# Patient Record
Sex: Male | Born: 1955
Health system: Southern US, Community
[De-identification: ages and names within clinical notes are randomized; demographics above are authoritative.]

## PROBLEM LIST (undated history)

## (undated) DIAGNOSIS — I10 Essential (primary) hypertension: Secondary | ICD-10-CM

## (undated) DIAGNOSIS — K219 Gastro-esophageal reflux disease without esophagitis: Secondary | ICD-10-CM

## (undated) DIAGNOSIS — E119 Type 2 diabetes mellitus without complications: Secondary | ICD-10-CM

## (undated) DIAGNOSIS — M069 Rheumatoid arthritis, unspecified: Secondary | ICD-10-CM

## (undated) DIAGNOSIS — E785 Hyperlipidemia, unspecified: Secondary | ICD-10-CM

## (undated) HISTORY — PX: TONSILLECTOMY: SUR1361

## (undated) HISTORY — PX: URETHRAL STRICTURE DILATATION: SHX477

---

## 1997-10-09 ENCOUNTER — Ambulatory Visit (HOSPITAL_BASED_OUTPATIENT_CLINIC_OR_DEPARTMENT_OTHER): Admission: RE | Admit: 1997-10-09 | Discharge: 1997-10-09 | Payer: Self-pay | Admitting: Urology

## 1998-10-25 ENCOUNTER — Other Ambulatory Visit: Admission: RE | Admit: 1998-10-25 | Discharge: 1998-10-25 | Payer: Self-pay | Admitting: Gastroenterology

## 2000-11-30 ENCOUNTER — Encounter: Payer: Self-pay | Admitting: Emergency Medicine

## 2000-11-30 ENCOUNTER — Inpatient Hospital Stay (HOSPITAL_COMMUNITY): Admission: EM | Admit: 2000-11-30 | Discharge: 2000-12-02 | Payer: Self-pay | Admitting: Plastic Surgery

## 2000-12-01 ENCOUNTER — Encounter: Payer: Self-pay | Admitting: Pulmonary Disease

## 2001-01-07 ENCOUNTER — Encounter: Payer: Self-pay | Admitting: Endocrinology

## 2001-01-07 ENCOUNTER — Ambulatory Visit (HOSPITAL_COMMUNITY): Admission: RE | Admit: 2001-01-07 | Discharge: 2001-01-07 | Payer: Self-pay | Admitting: Endocrinology

## 2001-02-04 ENCOUNTER — Encounter: Payer: Self-pay | Admitting: Endocrinology

## 2001-02-04 ENCOUNTER — Ambulatory Visit (HOSPITAL_COMMUNITY): Admission: RE | Admit: 2001-02-04 | Discharge: 2001-02-04 | Payer: Self-pay | Admitting: Endocrinology

## 2003-07-07 ENCOUNTER — Ambulatory Visit (HOSPITAL_COMMUNITY): Admission: RE | Admit: 2003-07-07 | Discharge: 2003-07-07 | Payer: Self-pay | Admitting: Internal Medicine

## 2004-04-18 ENCOUNTER — Ambulatory Visit: Payer: Self-pay | Admitting: Endocrinology

## 2004-04-20 ENCOUNTER — Ambulatory Visit: Payer: Self-pay | Admitting: Endocrinology

## 2004-05-31 ENCOUNTER — Ambulatory Visit: Payer: Self-pay | Admitting: Endocrinology

## 2005-05-09 ENCOUNTER — Ambulatory Visit: Payer: Self-pay | Admitting: Endocrinology

## 2005-07-10 ENCOUNTER — Ambulatory Visit: Payer: Self-pay | Admitting: Endocrinology

## 2005-07-13 ENCOUNTER — Ambulatory Visit: Payer: Self-pay | Admitting: Endocrinology

## 2006-01-20 ENCOUNTER — Emergency Department (HOSPITAL_COMMUNITY): Admission: EM | Admit: 2006-01-20 | Discharge: 2006-01-20 | Payer: Self-pay | Admitting: Family Medicine

## 2006-01-20 IMAGING — CR DG KNEE 1-2V*L*
2 series · 2 of 2 positions shown · non-contrast
Comparison: none

CLINICAL DATA: Left knee pain.  
 LEFT KNEE ? 2 VIEW:

[view not recorded (1 of 2)]
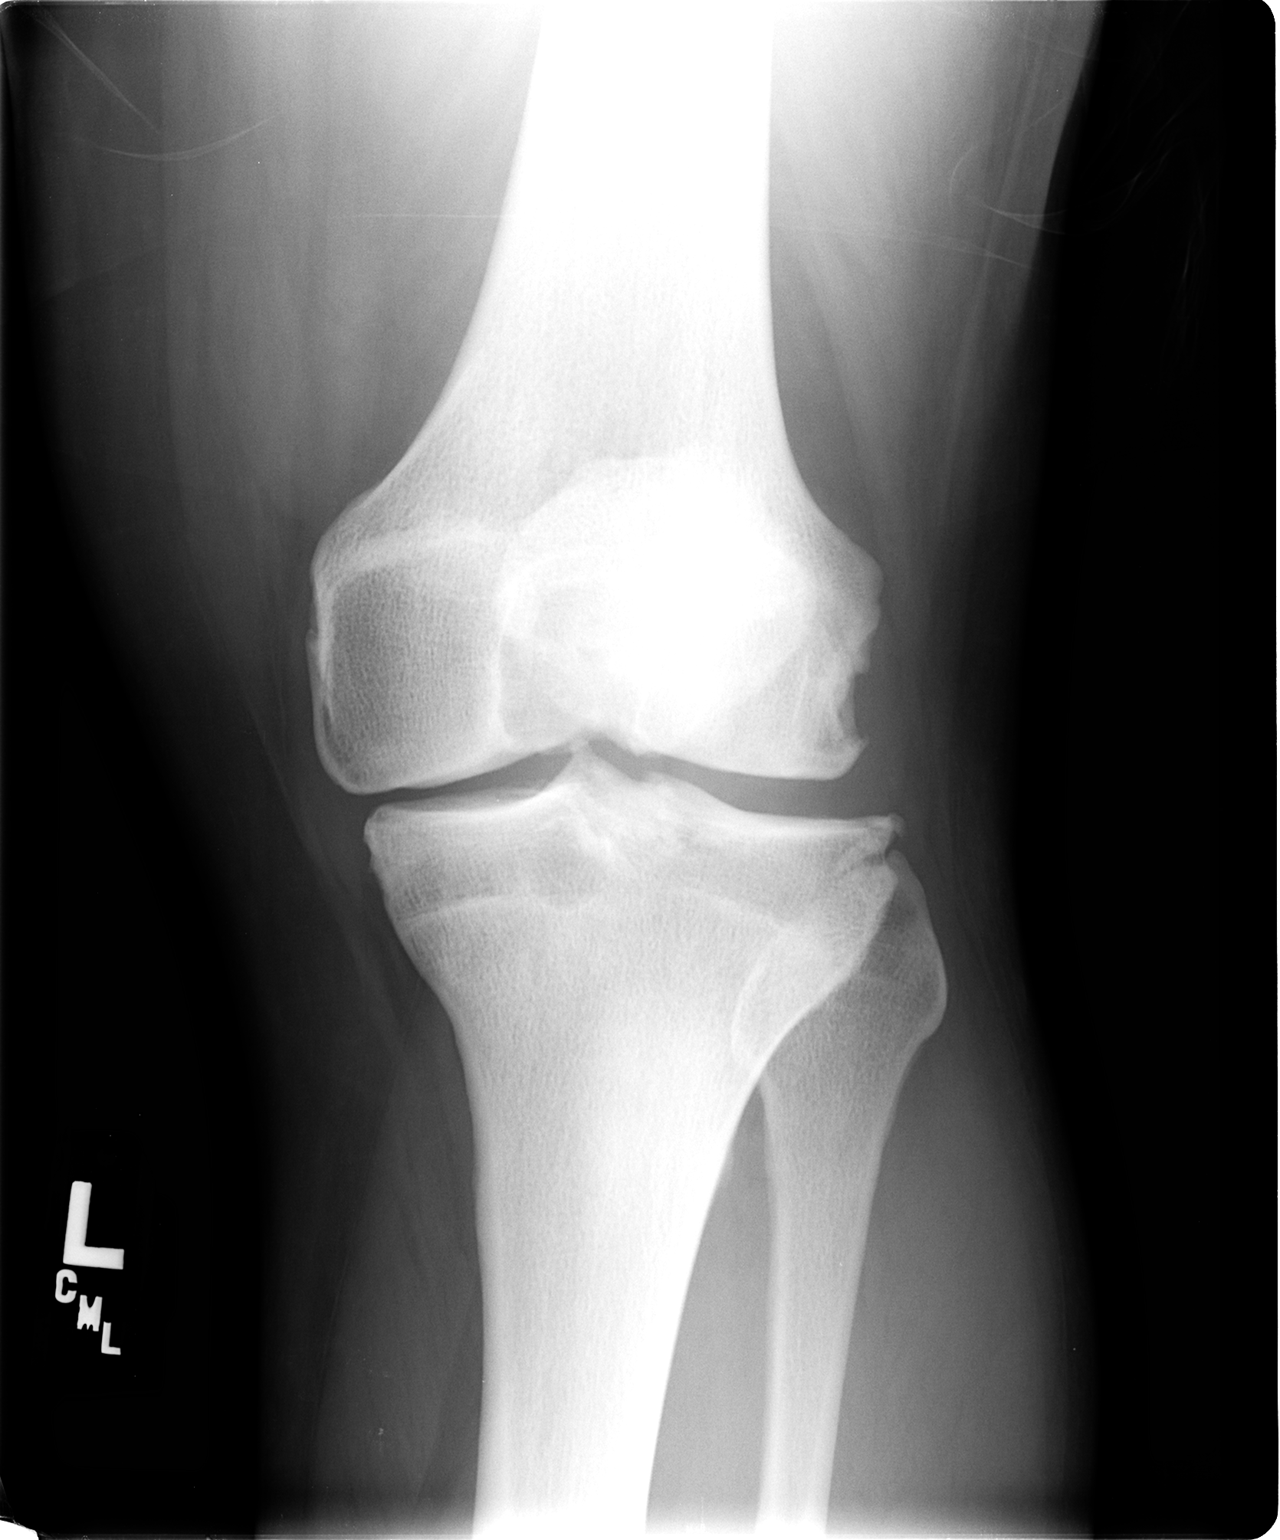

[view not recorded (2 of 2)]
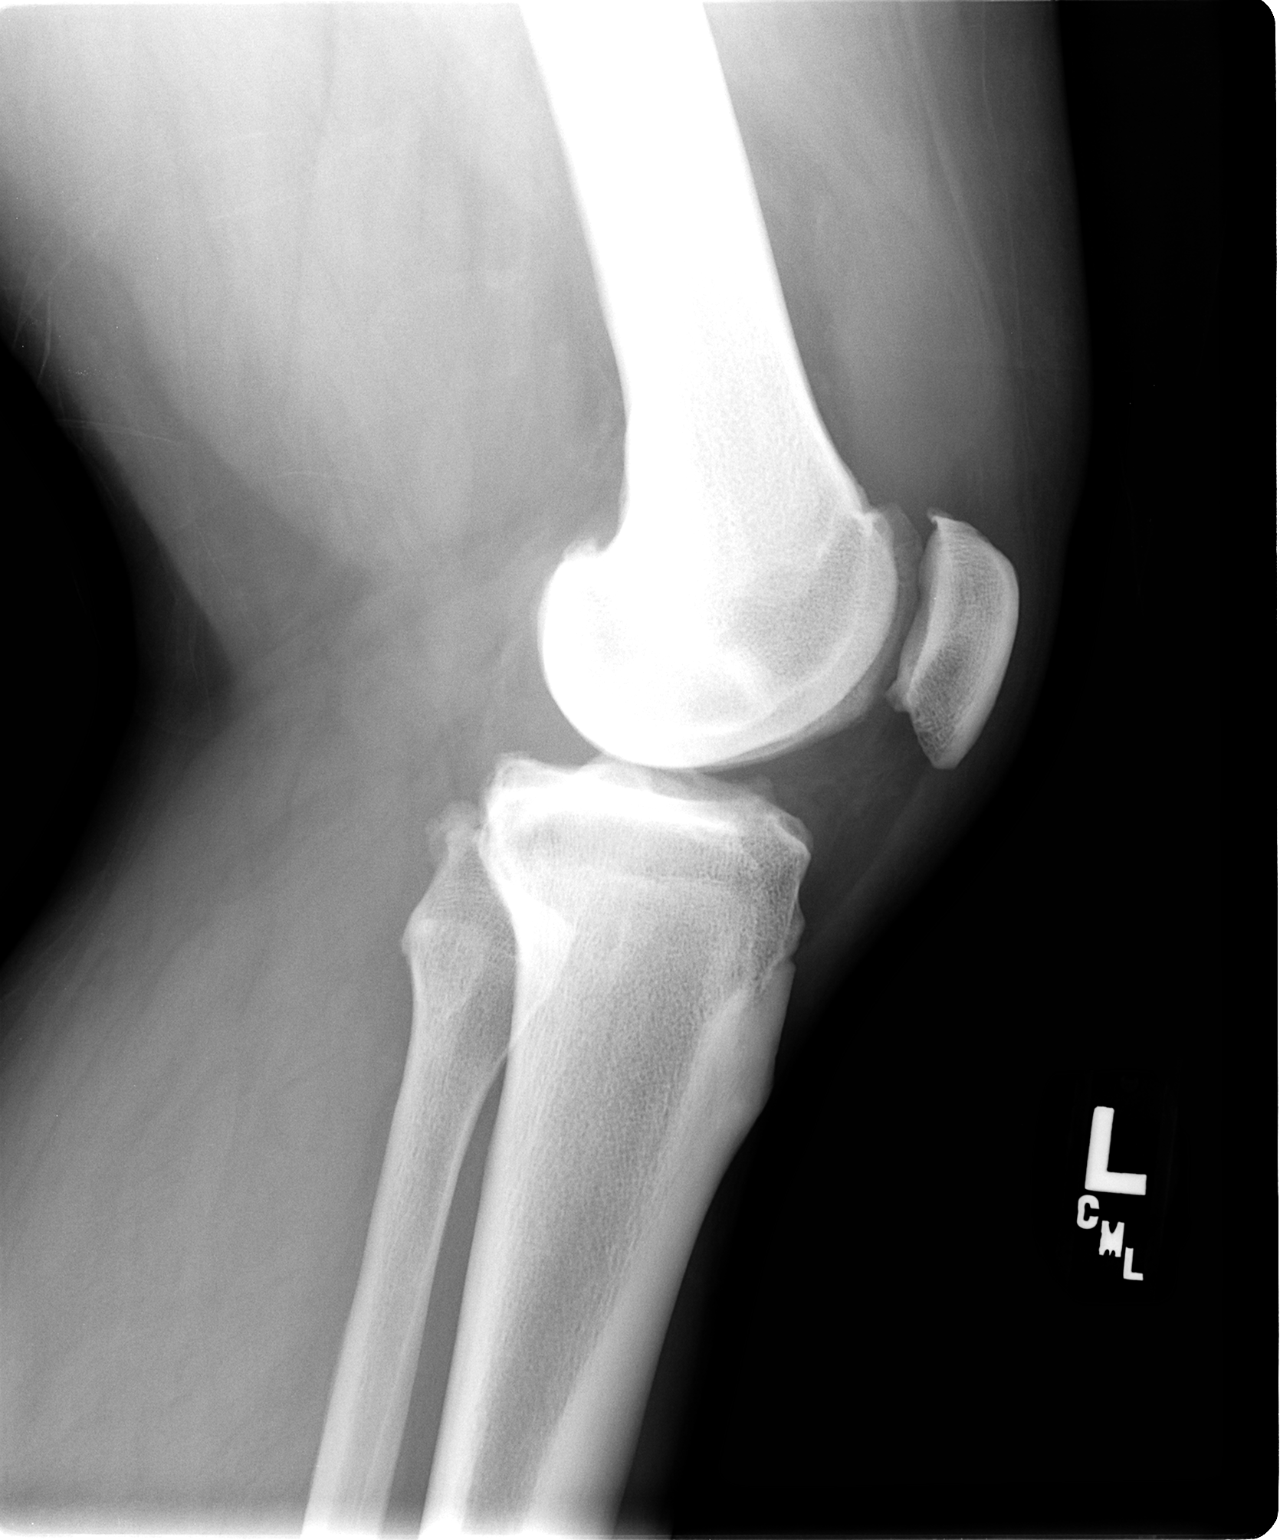

[2 of 2 positions shown; findings below may reference images not displayed]

FINDINGS: Tricompartmental degenerative change with subchondral cystic change within the tibia and femur.  Medial joint space loss.  Patellofemoral degenerative changes with large joint effusion.
IMPRESSION: Tricompartmental degenerative change mostly involving the patellofemoral medial compartments.  I suspect that the patient has underlying CPPD arthropathy.

## 2006-01-29 ENCOUNTER — Ambulatory Visit: Payer: Self-pay | Admitting: Endocrinology

## 2006-07-09 ENCOUNTER — Ambulatory Visit: Payer: Self-pay | Admitting: Endocrinology

## 2006-07-09 LAB — CONVERTED CEMR LAB
ALT: 42 units/L — ABNORMAL HIGH (ref 0–40)
AST: 40 units/L — ABNORMAL HIGH (ref 0–37)
Albumin: 3.6 g/dL (ref 3.5–5.2)
Alkaline Phosphatase: 69 units/L (ref 39–117)
BUN: 10 mg/dL (ref 6–23)
Basophils Absolute: 0 10*3/uL (ref 0.0–0.1)
Basophils Relative: 0.6 % (ref 0.0–1.0)
Bilirubin Urine: NEGATIVE
Bilirubin, Direct: 0.1 mg/dL (ref 0.0–0.3)
CO2: 33 meq/L — ABNORMAL HIGH (ref 19–32)
Calcium: 9.5 mg/dL (ref 8.4–10.5)
Chloride: 102 meq/L (ref 96–112)
Cholesterol: 134 mg/dL (ref 0–200)
Creatinine, Ser: 0.9 mg/dL (ref 0.4–1.5)
Eosinophils Absolute: 0.2 10*3/uL (ref 0.0–0.6)
Eosinophils Relative: 3.9 % (ref 0.0–5.0)
GFR calc Af Amer: 115 mL/min
GFR calc non Af Amer: 95 mL/min
Glucose, Bld: 137 mg/dL — ABNORMAL HIGH (ref 70–99)
HCT: 42.3 % (ref 39.0–52.0)
HDL: 40.3 mg/dL (ref >39.0)
Hemoglobin, Urine: NEGATIVE
Hemoglobin: 14.4 g/dL (ref 13.0–17.0)
Ketones, ur: NEGATIVE mg/dL
LDL Cholesterol: 61 mg/dL (ref 0–99)
Leukocytes, UA: NEGATIVE
Lymphocytes Relative: 28.7 % (ref 12.0–46.0)
MCHC: 34.1 g/dL (ref 30.0–36.0)
MCV: 88.3 fL (ref 78.0–100.0)
Monocytes Absolute: 0.6 10*3/uL (ref 0.2–0.7)
Monocytes Relative: 11.2 % — ABNORMAL HIGH (ref 3.0–11.0)
Neutro Abs: 3.1 10*3/uL (ref 1.4–7.7)
Neutrophils Relative %: 55.6 % (ref 43.0–77.0)
Nitrite: NEGATIVE
PSA: 0.42 ng/mL (ref 0.10–4.00)
Platelets: 283 10*3/uL (ref 150–400)
Potassium: 4.4 meq/L (ref 3.5–5.1)
RBC: 4.79 M/uL (ref 4.22–5.81)
RDW: 12.5 % (ref 11.5–14.6)
Sodium: 141 meq/L (ref 135–145)
Specific Gravity, Urine: 1.025 (ref 1.000–1.03)
TSH: 3.35 microintl units/mL (ref 0.35–5.50)
Total Bilirubin: 0.6 mg/dL (ref 0.3–1.2)
Total CHOL/HDL Ratio: 3.3
Total Protein, Urine: NEGATIVE mg/dL
Total Protein: 6.3 g/dL (ref 6.0–8.3)
Triglycerides: 163 mg/dL — ABNORMAL HIGH (ref 0–149)
Urine Glucose: NEGATIVE mg/dL
Urobilinogen, UA: 0.2 (ref 0.0–1.0)
VLDL: 33 mg/dL (ref 0–40)
WBC: 5.4 10*3/uL (ref 4.5–10.5)
pH: 5.5 (ref 5.0–8.0)

## 2006-07-13 ENCOUNTER — Ambulatory Visit: Payer: Self-pay | Admitting: Endocrinology

## 2006-07-13 LAB — CONVERTED CEMR LAB
HCV Ab: NEGATIVE
Microalb, Ur: 0.2 mg/dL (ref 0.0–1.9)

## 2006-12-12 ENCOUNTER — Encounter: Payer: Self-pay | Admitting: Endocrinology

## 2006-12-12 DIAGNOSIS — E119 Type 2 diabetes mellitus without complications: Secondary | ICD-10-CM

## 2006-12-12 DIAGNOSIS — E785 Hyperlipidemia, unspecified: Secondary | ICD-10-CM

## 2006-12-12 DIAGNOSIS — E782 Mixed hyperlipidemia: Secondary | ICD-10-CM | POA: Insufficient documentation

## 2006-12-12 DIAGNOSIS — I10 Essential (primary) hypertension: Secondary | ICD-10-CM | POA: Insufficient documentation

## 2006-12-12 DIAGNOSIS — K219 Gastro-esophageal reflux disease without esophagitis: Secondary | ICD-10-CM

## 2007-06-20 ENCOUNTER — Encounter: Payer: Self-pay | Admitting: Endocrinology

## 2007-11-12 ENCOUNTER — Telehealth: Payer: Self-pay | Admitting: Internal Medicine

## 2007-12-10 ENCOUNTER — Telehealth (INDEPENDENT_AMBULATORY_CARE_PROVIDER_SITE_OTHER): Payer: Self-pay | Admitting: *Deleted

## 2010-10-18 ENCOUNTER — Emergency Department (HOSPITAL_COMMUNITY)
Admission: EM | Admit: 2010-10-18 | Discharge: 2010-10-19 | Disposition: A | Discharge status: Home / Self Care | Payer: Self-pay | Attending: Emergency Medicine | Admitting: Emergency Medicine

## 2010-10-18 DIAGNOSIS — W261XXA Contact with sword or dagger, initial encounter: Secondary | ICD-10-CM | POA: Insufficient documentation

## 2010-10-18 DIAGNOSIS — IMO0002 Reserved for concepts with insufficient information to code with codable children: Secondary | ICD-10-CM | POA: Insufficient documentation

## 2010-10-18 DIAGNOSIS — E789 Disorder of lipoprotein metabolism, unspecified: Secondary | ICD-10-CM | POA: Insufficient documentation

## 2010-10-18 DIAGNOSIS — Z7982 Long term (current) use of aspirin: Secondary | ICD-10-CM | POA: Insufficient documentation

## 2010-10-18 DIAGNOSIS — S0100XA Unspecified open wound of scalp, initial encounter: Secondary | ICD-10-CM | POA: Insufficient documentation

## 2010-10-18 DIAGNOSIS — W260XXA Contact with knife, initial encounter: Secondary | ICD-10-CM | POA: Insufficient documentation

## 2010-10-18 DIAGNOSIS — K219 Gastro-esophageal reflux disease without esophagitis: Secondary | ICD-10-CM | POA: Insufficient documentation

## 2010-10-18 DIAGNOSIS — Z79899 Other long term (current) drug therapy: Secondary | ICD-10-CM | POA: Insufficient documentation

## 2010-10-18 DIAGNOSIS — E119 Type 2 diabetes mellitus without complications: Secondary | ICD-10-CM | POA: Insufficient documentation

## 2010-10-18 DIAGNOSIS — I10 Essential (primary) hypertension: Secondary | ICD-10-CM | POA: Insufficient documentation

## 2011-08-10 ENCOUNTER — Other Ambulatory Visit: Payer: Self-pay | Admitting: Internal Medicine

## 2011-08-10 ENCOUNTER — Ambulatory Visit
Admission: RE | Admit: 2011-08-10 | Discharge: 2011-08-10 | Disposition: A | Discharge status: Home / Self Care | Payer: Medicaid Other | Source: Ambulatory Visit | Attending: Internal Medicine | Admitting: Internal Medicine

## 2011-08-10 DIAGNOSIS — M25511 Pain in right shoulder: Secondary | ICD-10-CM

## 2011-11-28 ENCOUNTER — Ambulatory Visit: Payer: Medicaid Other | Attending: Sports Medicine | Admitting: Physical Therapy

## 2011-11-28 DIAGNOSIS — M25519 Pain in unspecified shoulder: Secondary | ICD-10-CM | POA: Insufficient documentation

## 2011-11-28 DIAGNOSIS — R293 Abnormal posture: Secondary | ICD-10-CM | POA: Insufficient documentation

## 2011-11-28 DIAGNOSIS — M25619 Stiffness of unspecified shoulder, not elsewhere classified: Secondary | ICD-10-CM | POA: Insufficient documentation

## 2011-11-28 DIAGNOSIS — IMO0001 Reserved for inherently not codable concepts without codable children: Secondary | ICD-10-CM | POA: Insufficient documentation

## 2011-12-06 ENCOUNTER — Ambulatory Visit: Payer: Medicaid Other | Attending: Sports Medicine | Admitting: Rehabilitation

## 2011-12-06 DIAGNOSIS — IMO0001 Reserved for inherently not codable concepts without codable children: Secondary | ICD-10-CM | POA: Insufficient documentation

## 2011-12-06 DIAGNOSIS — R293 Abnormal posture: Secondary | ICD-10-CM | POA: Insufficient documentation

## 2011-12-06 DIAGNOSIS — M25519 Pain in unspecified shoulder: Secondary | ICD-10-CM | POA: Insufficient documentation

## 2011-12-06 DIAGNOSIS — M25619 Stiffness of unspecified shoulder, not elsewhere classified: Secondary | ICD-10-CM | POA: Insufficient documentation

## 2011-12-12 ENCOUNTER — Ambulatory Visit: Payer: Medicaid Other | Admitting: Rehabilitation

## 2011-12-19 ENCOUNTER — Ambulatory Visit: Payer: Medicaid Other | Admitting: Rehabilitation

## 2013-09-04 ENCOUNTER — Emergency Department (HOSPITAL_COMMUNITY): Payer: No Typology Code available for payment source

## 2013-09-04 ENCOUNTER — Encounter (HOSPITAL_COMMUNITY): Payer: Self-pay | Admitting: Emergency Medicine

## 2013-09-04 ENCOUNTER — Observation Stay (HOSPITAL_COMMUNITY)
Admission: EM | Admit: 2013-09-04 | Discharge: 2013-09-06 | Disposition: A | Discharge status: Home / Self Care | Payer: No Typology Code available for payment source | Attending: Internal Medicine | Admitting: Internal Medicine

## 2013-09-04 ENCOUNTER — Observation Stay (HOSPITAL_COMMUNITY): Payer: No Typology Code available for payment source

## 2013-09-04 DIAGNOSIS — K219 Gastro-esophageal reflux disease without esophagitis: Secondary | ICD-10-CM | POA: Diagnosis present

## 2013-09-04 DIAGNOSIS — R209 Unspecified disturbances of skin sensation: Secondary | ICD-10-CM | POA: Insufficient documentation

## 2013-09-04 DIAGNOSIS — R079 Chest pain, unspecified: Secondary | ICD-10-CM | POA: Diagnosis present

## 2013-09-04 DIAGNOSIS — Z87891 Personal history of nicotine dependence: Secondary | ICD-10-CM | POA: Insufficient documentation

## 2013-09-04 DIAGNOSIS — R200 Anesthesia of skin: Secondary | ICD-10-CM | POA: Diagnosis present

## 2013-09-04 DIAGNOSIS — M069 Rheumatoid arthritis, unspecified: Secondary | ICD-10-CM | POA: Diagnosis present

## 2013-09-04 DIAGNOSIS — E782 Mixed hyperlipidemia: Secondary | ICD-10-CM | POA: Diagnosis present

## 2013-09-04 DIAGNOSIS — E7800 Pure hypercholesterolemia, unspecified: Secondary | ICD-10-CM | POA: Insufficient documentation

## 2013-09-04 DIAGNOSIS — E119 Type 2 diabetes mellitus without complications: Secondary | ICD-10-CM | POA: Diagnosis present

## 2013-09-04 DIAGNOSIS — Z23 Encounter for immunization: Secondary | ICD-10-CM | POA: Insufficient documentation

## 2013-09-04 DIAGNOSIS — R0789 Other chest pain: Principal | ICD-10-CM | POA: Insufficient documentation

## 2013-09-04 DIAGNOSIS — E785 Hyperlipidemia, unspecified: Secondary | ICD-10-CM

## 2013-09-04 DIAGNOSIS — I10 Essential (primary) hypertension: Secondary | ICD-10-CM | POA: Diagnosis present

## 2013-09-04 HISTORY — DX: Gastro-esophageal reflux disease without esophagitis: K21.9

## 2013-09-04 HISTORY — DX: Type 2 diabetes mellitus without complications: E11.9

## 2013-09-04 HISTORY — DX: Essential (primary) hypertension: I10

## 2013-09-04 HISTORY — DX: Rheumatoid arthritis, unspecified: M06.9

## 2013-09-04 HISTORY — DX: Hyperlipidemia, unspecified: E78.5

## 2013-09-04 LAB — URINALYSIS, ROUTINE W REFLEX MICROSCOPIC
Bilirubin Urine: NEGATIVE
Hgb urine dipstick: NEGATIVE
KETONES UR: NEGATIVE mg/dL
LEUKOCYTES UA: NEGATIVE
NITRITE: NEGATIVE
PH: 5 (ref 5.0–8.0)
Protein, ur: NEGATIVE mg/dL
SPECIFIC GRAVITY, URINE: 1.032 — AB (ref 1.005–1.030)
Urobilinogen, UA: 0.2 mg/dL (ref 0.0–1.0)

## 2013-09-04 LAB — COMPREHENSIVE METABOLIC PANEL
ALBUMIN: 4 g/dL (ref 3.5–5.2)
ALT: 38 U/L (ref 0–53)
AST: 37 U/L (ref 0–37)
Alkaline Phosphatase: 127 U/L — ABNORMAL HIGH (ref 39–117)
BUN: 17 mg/dL (ref 6–23)
CALCIUM: 10 mg/dL (ref 8.4–10.5)
CO2: 29 meq/L (ref 19–32)
CREATININE: 0.71 mg/dL (ref 0.50–1.35)
Chloride: 95 mEq/L — ABNORMAL LOW (ref 96–112)
GFR calc Af Amer: >90 mL/min (ref >90)
GFR calc non Af Amer: >90 mL/min (ref >90)
Glucose, Bld: 260 mg/dL — ABNORMAL HIGH (ref 70–99)
Potassium: 4.7 mEq/L (ref 3.7–5.3)
SODIUM: 139 meq/L (ref 137–147)
TOTAL PROTEIN: 7.5 g/dL (ref 6.0–8.3)
Total Bilirubin: 0.6 mg/dL (ref 0.3–1.2)

## 2013-09-04 LAB — CBC
HCT: 43.9 % (ref 39.0–52.0)
Hemoglobin: 15.4 g/dL (ref 13.0–17.0)
MCH: 30 pg (ref 26.0–34.0)
MCHC: 35.1 g/dL (ref 30.0–36.0)
MCV: 85.4 fL (ref 78.0–100.0)
PLATELETS: 224 10*3/uL (ref 150–400)
RBC: 5.14 MIL/uL (ref 4.22–5.81)
RDW: 12.9 % (ref 11.5–15.5)
WBC: 6.7 10*3/uL (ref 4.0–10.5)

## 2013-09-04 LAB — URINE MICROSCOPIC-ADD ON

## 2013-09-04 LAB — I-STAT TROPONIN, ED
TROPONIN I, POC: 0 ng/mL (ref 0.00–0.08)
Troponin i, poc: 0 ng/mL (ref 0.00–0.08)

## 2013-09-04 LAB — GLUCOSE, CAPILLARY
GLUCOSE-CAPILLARY: 242 mg/dL — AB (ref 70–99)
Glucose-Capillary: 207 mg/dL — ABNORMAL HIGH (ref 70–99)

## 2013-09-04 LAB — TROPONIN I

## 2013-09-04 MED ORDER — INSULIN ASPART 100 UNIT/ML ~~LOC~~ SOLN
0.0000 [IU] | Freq: Three times a day (TID) | SUBCUTANEOUS | Status: DC
  Administered 2013-09-05: 5 [IU] via SUBCUTANEOUS
  Administered 2013-09-05: 2 [IU] via SUBCUTANEOUS
  Administered 2013-09-05 – 2013-09-06 (×2): 3 [IU] via SUBCUTANEOUS

## 2013-09-04 MED ORDER — HYDROCHLOROTHIAZIDE 25 MG PO TABS
25.0000 mg | ORAL_TABLET | Freq: Every day | ORAL | Status: DC
  Administered 2013-09-04 – 2013-09-05 (×2): 25 mg via ORAL
  Filled 2013-09-04 (×2): qty 1

## 2013-09-04 MED ORDER — NITROGLYCERIN 0.4 MG SL SUBL: 0.4000 mg | SUBLINGUAL_TABLET | SUBLINGUAL | Status: DC | PRN

## 2013-09-04 MED ORDER — PNEUMOCOCCAL VAC POLYVALENT 25 MCG/0.5ML IJ INJ
0.5000 mL | INJECTION | INTRAMUSCULAR | Status: AC
  Administered 2013-09-05: 0.5 mL via INTRAMUSCULAR
  Filled 2013-09-04: qty 0.5

## 2013-09-04 MED ORDER — ASPIRIN EC 325 MG PO TBEC
325.0000 mg | DELAYED_RELEASE_TABLET | Freq: Every day | ORAL | Status: DC
  Administered 2013-09-05 – 2013-09-06 (×2): 325 mg via ORAL
  Filled 2013-09-04 (×2): qty 1

## 2013-09-04 MED ORDER — ONDANSETRON HCL 4 MG PO TABS: 4.0000 mg | ORAL_TABLET | Freq: Four times a day (QID) | ORAL | Status: DC | PRN

## 2013-09-04 MED ORDER — PANTOPRAZOLE SODIUM 40 MG PO TBEC
40.0000 mg | DELAYED_RELEASE_TABLET | Freq: Two times a day (BID) | ORAL | Status: DC
  Administered 2013-09-04 – 2013-09-06 (×4): 40 mg via ORAL
  Filled 2013-09-04 (×4): qty 1

## 2013-09-04 MED ORDER — ALBUTEROL SULFATE (2.5 MG/3ML) 0.083% IN NEBU: 2.5000 mg | INHALATION_SOLUTION | RESPIRATORY_TRACT | Status: DC | PRN

## 2013-09-04 MED ORDER — LISINOPRIL 20 MG PO TABS
20.0000 mg | ORAL_TABLET | Freq: Every day | ORAL | Status: DC
  Administered 2013-09-04 – 2013-09-05 (×2): 20 mg via ORAL
  Filled 2013-09-04 (×2): qty 1

## 2013-09-04 MED ORDER — SODIUM CHLORIDE 0.9 % IJ SOLN
3.0000 mL | Freq: Two times a day (BID) | INTRAMUSCULAR | Status: DC
  Administered 2013-09-05 – 2013-09-06 (×3): 3 mL via INTRAVENOUS

## 2013-09-04 MED ORDER — ACETAMINOPHEN 650 MG RE SUPP: 650.0000 mg | Freq: Four times a day (QID) | RECTAL | Status: DC | PRN

## 2013-09-04 MED ORDER — TRAMADOL HCL 50 MG PO TABS
50.0000 mg | ORAL_TABLET | Freq: Two times a day (BID) | ORAL | Status: DC | PRN
  Administered 2013-09-04: 50 mg via ORAL
  Filled 2013-09-04: qty 1

## 2013-09-04 MED ORDER — ASPIRIN 81 MG PO CHEW
243.0000 mg | CHEWABLE_TABLET | Freq: Once | ORAL | Status: AC
  Administered 2013-09-04: 243 mg via ORAL
  Filled 2013-09-04: qty 3

## 2013-09-04 MED ORDER — INSULIN ASPART 100 UNIT/ML ~~LOC~~ SOLN
0.0000 [IU] | Freq: Every day | SUBCUTANEOUS | Status: DC
  Administered 2013-09-04 – 2013-09-05 (×2): 2 [IU] via SUBCUTANEOUS

## 2013-09-04 MED ORDER — ENOXAPARIN SODIUM 40 MG/0.4ML ~~LOC~~ SOLN
40.0000 mg | SUBCUTANEOUS | Status: DC
  Administered 2013-09-04: 40 mg via SUBCUTANEOUS
  Filled 2013-09-04 (×2): qty 0.4

## 2013-09-04 MED ORDER — SIMVASTATIN 40 MG PO TABS
40.0000 mg | ORAL_TABLET | Freq: Every day | ORAL | Status: DC
  Administered 2013-09-04 – 2013-09-05 (×2): 40 mg via ORAL
  Filled 2013-09-04 (×4): qty 1

## 2013-09-04 MED ORDER — ONDANSETRON HCL 4 MG/2ML IJ SOLN: 4.0000 mg | Freq: Four times a day (QID) | INTRAMUSCULAR | Status: DC | PRN

## 2013-09-04 MED ORDER — ACETAMINOPHEN 325 MG PO TABS
650.0000 mg | ORAL_TABLET | Freq: Four times a day (QID) | ORAL | Status: DC | PRN
  Administered 2013-09-04: 650 mg via ORAL
  Filled 2013-09-04: qty 2

## 2013-09-04 MED ORDER — MORPHINE SULFATE 2 MG/ML IJ SOLN: 1.0000 mg | INTRAMUSCULAR | Status: DC | PRN

## 2013-09-04 NOTE — ED Provider Notes (Signed)
CSN: [id number]     Arrival date & time 09/04/13  1259 History   First MD Initiated Contact with Patient 09/04/13 1351     Chief Complaint  Patient presents with  . Chest Pain     (Consider location/radiation/quality/duration/timing/severity/associated sxs/prior Treatment) HPI  Hunter Clarke is a 58 y.o. male c/o left sided chest discomfort (denies actual pain, pressure) it is associated with a left hand numbness in the first 4 digits, denies chest pain, unable to quantify level of discomfort. States that "thinks he is having a heart attack."   tates that the discomfort in the chest has resolved but he still has the left finger numbness. All symptoms onset 11:50 AM after waking up. Pain has been constant, non-exertional, non-pleuritic or positional.  Denies SOB, emesis, diaphoresis, cough, fever, back pain, syncope, prior episodes, recent cocaine/methamphetimine use. Denies h/o DVT, PE,  recent travel, leg swelling, hemoptysis.  Pt took 81mg  ASA.    RF: NIDDM, HTN, HLD, Former smoker, patient states that he has had prior heart attack several years ago but has not followed with cardiology Cath: ? Last Stress test: >10years ago Cardiologost:  ? PCP: Jonelle Sidle   Past Medical History  Diagnosis Date  . Diabetes mellitus without complication   . Hypertension   . Acid reflux   . High cholesterol    History reviewed. No pertinent past surgical history. No family history on file. History  Substance Use Topics  . Smoking status: Former Research scientist (life sciences)  . Smokeless tobacco: Not on file  . Alcohol Use: No    Review of Systems  10 systems reviewed and found to be negative, except as noted in the HPI.  Allergies  Tikosyn  Home Medications   Prior to Admission medications   Not on File   BP 109/49  Pulse 87  Temp(Src) 97.6 F (36.4 C) (Oral)  Resp 11  SpO2 97% Physical Exam  Nursing note and vitals reviewed. Constitutional: He is oriented to person, place, and time. He appears  well-developed and well-nourished. No distress.  HENT:  Head: Normocephalic and atraumatic.  Mouth/Throat: Oropharynx is clear and moist.  Eyes: Conjunctivae and EOM are normal. Pupils are equal, round, and reactive to light.  Neck: Normal range of motion. Neck supple.  Cardiovascular: Normal rate and regular rhythm.   Pulmonary/Chest: Effort normal and breath sounds normal. No stridor. No respiratory distress. He has no wheezes. He has no rales. He exhibits no tenderness.  Abdominal: Soft. Bowel sounds are normal. He exhibits no distension and no mass. There is no tenderness. There is no rebound and no guarding.  Musculoskeletal: Normal range of motion. He exhibits no edema and no tenderness.       Hands: Reports reduced sensation in left digits 1-4 and palm.  Neurological: He is alert and oriented to person, place, and time.  Follows commands, Clear, goal oriented speech, Strength is 5 out of 5x4 extremities, patient ambulates with a coordinated in nonantalgic gait.    Psychiatric: He has a normal mood and affect.    ED Course  Procedures (including critical care time) Labs Review Labs Reviewed  COMPREHENSIVE METABOLIC PANEL - Abnormal; Notable for the following:    Chloride 95 (*)    Glucose, Bld 260 (*)    Alkaline Phosphatase 127 (*)    All other components within normal limits  URINALYSIS, ROUTINE W REFLEX MICROSCOPIC - Abnormal; Notable for the following:    Specific Gravity, Urine 1.032 (*)    Glucose, UA >1000 (*)  All other components within normal limits  CBC  URINE MICROSCOPIC-ADD ON  Randolm Idol, ED  Randolm Idol, ED    Imaging Review Dg Chest 2 View  09/04/2013   CLINICAL DATA:  Chest pain.  EXAM: CHEST  2 VIEW  COMPARISON:  None.  FINDINGS: Mediastinum and hilar structures normal. Mild infiltrate present left lung base. Followup PA and lateral chest x-ray should be obtained to demonstrate clearing and the absence of a mass lesion. Heart size normal. No  acute bony abnormality identified 2  IMPRESSION: Ill-defined density in the left lung base. This may represent a small pulmonary infiltrate. Close follow-up chest x-rays should be obtained to demonstrate clearing to exclude underlying mass lesion.   Electronically Signed   By: Marcello Moores  Register   On: 09/04/2013 13:56     EKG Interpretation   Date/Time:  Thursday Sep 04 2013 13:02:57 EDT Ventricular Rate:  90 PR Interval:  160 QRS Duration: 102 QT Interval:  370 QTC Calculation: 452 R Axis:   -73 Text Interpretation:  Normal sinus rhythm Left anterior fascicular block  Possible Anterior infarct , age undetermined Abnormal ECG No significant  change since last tracing Confirmed by [facility]  MD, [place] [phone]) on  09/04/2013 2:31:53 PM      MDM   Final diagnoses:  Chest pain    Filed Vitals:   09/04/13 1310 09/04/13 1430 09/04/13 1448  BP: 137/86 103/55 109/49  Pulse: 88 91 87  Temp: 97.6 F (36.4 C)    TempSrc: Oral    Resp: 18 19 11   SpO2: 98% 98% 97%    Medications  aspirin chewable tablet 243 mg (243 mg Oral Given 09/04/13 1417)    Hunter Clarke is a 58 y.o. male presenting with chest discomfort, numbness in left first 4 fingers and palm. Patient has extensive cardiac history and has not followed with cardiology and a decade. Neuro exam is nonfocal, EKG is nonischemic and troponin is negative. Blood work unconcerned and except for a mildly elevated glucose with no anion gap.  This is a shared visit with the attending physician who personally evaluated the patient and agrees with the care plan, doubts CNS involvement in left hand numbness.  Patient will be admitted to Dr. Lavella Lemons to tele bed.   Note: Portions of this report may have been transcribed using voice recognition software. Every effort was made to ensure accuracy; however, inadvertent computerized transcription errors may be present    Monico Blitz, PA-C 09/04/13 1622

## 2013-09-04 NOTE — H&P (Signed)
History and Physical  NYMIR RINGLER HUD:[id number] DOB: 02-28-1956 DOA: 09/04/2013  Referring physician: EDP PCP: Barbette Merino, MD  Outpatient Specialists:  1. None  Chief Complaint: Chest pain and numbness of left hand fingers.  HPI: Hunter Clarke is a 58 y.o. male with PMH of DM 2, HTN, dyslipidemia, GERD, former smoker, RA, strong family history of heart disease (a maternal uncle died of MI at age 76 yrs, others with Heart disease), presented to the ED with complaints of chest pain and numbness of left hand fingers. He was in his usual state of health until noon on 09/04/13 when he woke up from a nap with midsternal chest discomfort, rated as mild to moderate in severity, nonradiating, not pleuritic in nature, associated with nausea but no dyspnea or palpitations. No cough, fever or chills. He also complains of numbness of the lateral four fingers of left hand extending up to the wrist. He was sleeping on a roll of a bed but cannot recollect sleeping in an abnormal position. He denies weakness in the left hand, slurred speech, headache or twisting of face. Patient states that the current episode of chest discomfort is not similar to GERD. He took a baby aspirin and presented to the ED. No history of long distance travel. He states that he had an episode of chest pain approximately 10 years ago but was told that he did not have a heart attack. Stress test subsequently apparently was negative. In the ED, EKG without acute changes, POC troponin x2 negative. Hospitalist admission requested.   Review of Systems: All systems reviewed and apart from history of presenting illness, are negative.  Past Medical History  Diagnosis Date  . Diabetes mellitus without complication   . Hypertension   . Acid reflux   . High cholesterol   . Dyslipidemia   . RA (rheumatoid arthritis)    History reviewed. No pertinent past surgical history. Social History:  reports that he has quit smoking. His smoking  use included Cigarettes. He has a 40 pack-year smoking history. He does not have any smokeless tobacco history on file. He reports that he does not drink alcohol or use illicit drugs.  married. Independent of activities of daily living.   Allergies  Allergen Reactions  . Tikosyn [Dofetilide] Other (See Comments)    Unknown reaction     Family History  Problem Relation Age of Onset  . Heart attack Mother   . Stroke Mother   . Heart attack Other     Prior to Admission medications   Medication Sig Start Date End Date Taking? Authorizing Provider  acetaminophen (TYLENOL) 325 MG tablet Take 325-975 mg by mouth every 6 (six) hours as needed for moderate pain.   Yes Historical Provider, MD  aspirin 81 MG tablet Take 81 mg by mouth daily.   Yes Historical Provider, MD  Glucosamine-Chondroit-Vit C-Mn (GLUCOSAMINE 1500 COMPLEX PO) Take 3,000 mg by mouth daily.   Yes Historical Provider, MD  hydrochlorothiazide (HYDRODIURIL) 25 MG tablet Take 25 mg by mouth daily. 08/07/13  Yes Historical Provider, MD  lisinopril (PRINIVIL,ZESTRIL) 20 MG tablet Take 20 mg by mouth daily. 08/07/13  Yes Historical Provider, MD  metFORMIN (GLUCOPHAGE) 1000 MG tablet Take 1,000 mg by mouth 2 (two) times daily. 08/07/13  Yes Historical Provider, MD  Multiple Vitamins-Minerals (MULTIVITAMIN PO) Take 1 tablet by mouth daily.   Yes Historical Provider, MD  neomycin-bacitracin-polymyxin (NEOSPORIN) ointment Apply 1 application topically daily as needed for wound care.   Yes  Historical Provider, MD  omeprazole (PRILOSEC) 20 MG capsule Take 20 mg by mouth 2 (two) times daily. 08/15/13  Yes Historical Provider, MD  simvastatin (ZOCOR) 40 MG tablet Take 40 mg by mouth daily. 08/07/13  Yes Historical Provider, MD  traMADol (ULTRAM) 50 MG tablet Take 50 mg by mouth 2 (two) times daily as needed for moderate pain.  08/07/13  Yes Historical Provider, MD   Physical Exam: Filed Vitals:   09/04/13 1310 09/04/13 1430 09/04/13 1448 09/04/13  1615  BP: 137/86 103/55 109/49 116/57  Pulse: 88 91 87 87  Temp: 97.6 F (36.4 C)     TempSrc: Oral     Resp: 18 19 11    SpO2: 98% 98% 97% 99%     General exam: Moderately built and obese male  patient, lying comfortably propped up on the gurney in no obvious distress.  Head, eyes and ENT: Nontraumatic and normocephalic. Pupils equally reacting to light and accommodation. Oral mucosa moist.  Neck: Supple. No JVD, carotid bruit or thyromegaly.  Lymphatics: No lymphadenopathy.  Respiratory system: Clear to auscultation. No increased work of breathing.  Cardiovascular system: S1 and S2 heard, RRR. No JVD, murmurs, gallops, clicks or pedal edema.  Gastrointestinal system: Abdomen is nondistended, soft and nontender. Normal bowel sounds heard. No organomegaly or masses appreciated.  Central nervous system: Alert and oriented. No focal neurological deficits.  Extremities: Symmetric 5 x 5 power. Peripheral pulses symmetrically felt.  Numbness over lateral four fingers of left hand without associated weakness. No pronator drift.   Skin: No rashes or acute findings.  Musculoskeletal system: Negative exam.  Psychiatry: Pleasant and cooperative.   Labs on Admission:  Basic Metabolic Panel:  Recent Labs Lab 09/04/13 1308  NA 139  K 4.7  CL 95*  CO2 29  GLUCOSE 260*  BUN 17  CREATININE 0.71  CALCIUM 10.0   Liver Function Tests:  Recent Labs Lab 09/04/13 1308  AST 37  ALT 38  ALKPHOS 127*  BILITOT 0.6  PROT 7.5  ALBUMIN 4.0   No results found for this basename: LIPASE, AMYLASE,  in the last 168 hours No results found for this basename: AMMONIA,  in the last 168 hours CBC:  Recent Labs Lab 09/04/13 1308  WBC 6.7  HGB 15.4  HCT 43.9  MCV 85.4  PLT 224   Cardiac Enzymes: No results found for this basename: CKTOTAL, CKMB, CKMBINDEX, TROPONINI,  in the last 168 hours  BNP (last 3 results) No results found for this basename: PROBNP,  in the last 8760  hours CBG: No results found for this basename: GLUCAP,  in the last 168 hours  Radiological Exams on Admission: Dg Chest 2 View  09/04/2013   CLINICAL DATA:  Chest pain.  EXAM: CHEST  2 VIEW  COMPARISON:  None.  FINDINGS: Mediastinum and hilar structures normal. Mild infiltrate present left lung base. Followup PA and lateral chest x-ray should be obtained to demonstrate clearing and the absence of a mass lesion. Heart size normal. No acute bony abnormality identified 2  IMPRESSION: Ill-defined density in the left lung base. This may represent a small pulmonary infiltrate. Close follow-up chest x-rays should be obtained to demonstrate clearing to exclude underlying mass lesion.   Electronically Signed   By: Marcello Moores  Register   On: 09/04/2013 13:56    EKG: Independently reviewed.  sinus rhythm, left anterior fascicular block, slow R wave progression in anterior leads but no acute changes. No prior EKGs to compare.  Assessment/Plan Principal Problem:  Chest pain Active Problems:   DIABETES MELLITUS, TYPE II   HYPERLIPIDEMIA   HYPERTENSION   GERD   Numbness of fingers   RA (rheumatoid arthritis)    58 year old male patient with history of DM 2, HTN, dyslipidemia, former smoker, strong family history of CAD, obesity, RA, presents with chest discomfort and numbness of left hand fingers.  1. Chest pain: CAD risk factors: DM 2, HTN, dyslipidemia, former smoker, strong family history and obesity (Heart Score: 5). Admit to telemetry for observation. Cycle cardiac enzymes. Check 2-D echo. N.p.o. after midnight for possible stress test in a.m. Patient has received full dose aspirin today. Sublingual NTG when necessary. 2. Numbness of left hand lateral 4 fingers: Does not seem to be related to problem #1 and not strongly indicative of stroke but could be. Check MRI brain without contrast and further stroke workup if positive. 3. Uncontrolled type II DM: Hold metformin in case he were to get contrasted  study. SSI. Monitor CBGs 4. Hypertension: Continue HCTZ and lisinopril. Controlled 5. GERD: Continue PPI 6. History of RA: Not on medications 7. Abnormal chest x-ray: Ill-defined density in left lung base-followup chest x-ray in a week's versus CT chest to further evaluate as outpatient.     Code Status:  full  Family Communication:  discussed with patient's son at bedside.  Disposition Plan:  admit to telemetry for observation. Possible discharge 1-2 days.   Time spent:  60 minutes  Modena Jansky, MD, FACP, [facility]. [facility] Pager [phone]  If 7PM-7AM, please contact night-coverage [email or web address] Password [facility] 09/04/2013, 4:28 PM

## 2013-09-04 NOTE — ED Notes (Signed)
Woke up w/ cp at 12 noon  No n/v/sob  And left hand numb he states

## 2013-09-04 NOTE — ED Provider Notes (Signed)
Medical screening examination/treatment/procedure(s) were conducted as a shared visit with non-physician practitioner(s) and myself.  I personally evaluated the patient during the encounter.   EKG Interpretation   Date/Time:  Thursday Sep 04 2013 13:02:57 EDT Ventricular Rate:  90 PR Interval:  160 QRS Duration: 102 QT Interval:  370 QTC Calculation: 452 R Axis:   -73 Text Interpretation:  Normal sinus rhythm Left anterior fascicular block  Possible Anterior infarct , age undetermined Abnormal ECG No significant  change since last tracing Confirmed by [facility]  MD, [place] [phone]) on  09/04/2013 2:31:53 PM     Presents to the ER for evaluation of chest pain. He describes it as a "discomfort". It is left chest in nature. He has not had exertional pain. He also complains of numbness and tingling in digits one through 4 on the left hand. He has not noticed any weakness. There is no numbness or tingling proximal to the wrist, this is left arm weakness or numbness, simply involving the fingers. I-123 had some discoloration of the fingers, but by my examination it was normal compared to the left. He has normal capillary refill. Radial pulses palpable. No concern for vascular compromise.  Patient has multiple cardiac risk factors and stated history of MI/heart disease. Based on this, will ask medicine to evaluate him for admission.  Orpah Greek, MD 09/04/13 [phone]

## 2013-09-04 NOTE — ED Notes (Signed)
Admitting MD at bedside for assessment

## 2013-09-05 ENCOUNTER — Other Ambulatory Visit: Payer: Self-pay | Admitting: Cardiology

## 2013-09-05 ENCOUNTER — Encounter (HOSPITAL_COMMUNITY): Payer: Self-pay | Admitting: Cardiology

## 2013-09-05 DIAGNOSIS — E785 Hyperlipidemia, unspecified: Secondary | ICD-10-CM

## 2013-09-05 DIAGNOSIS — I517 Cardiomegaly: Secondary | ICD-10-CM | POA: Diagnosis not present

## 2013-09-05 DIAGNOSIS — M069 Rheumatoid arthritis, unspecified: Secondary | ICD-10-CM

## 2013-09-05 DIAGNOSIS — R072 Precordial pain: Secondary | ICD-10-CM

## 2013-09-05 DIAGNOSIS — K219 Gastro-esophageal reflux disease without esophagitis: Secondary | ICD-10-CM

## 2013-09-05 LAB — GLUCOSE, CAPILLARY
GLUCOSE-CAPILLARY: 211 mg/dL — AB (ref 70–99)
Glucose-Capillary: 198 mg/dL — ABNORMAL HIGH (ref 70–99)
Glucose-Capillary: 283 mg/dL — ABNORMAL HIGH (ref 70–99)
Glucose-Capillary: 225 mg/dL — ABNORMAL HIGH (ref 70–99)

## 2013-09-05 LAB — HEMOGLOBIN A1C
Hgb A1c MFr Bld: 10.4 % — ABNORMAL HIGH (ref <5.7)
Mean Plasma Glucose: 252 mg/dL — ABNORMAL HIGH (ref <117)

## 2013-09-05 LAB — TROPONIN I

## 2013-09-05 MED ORDER — GABAPENTIN 100 MG PO CAPS: 200.0000 mg | ORAL_CAPSULE | Freq: Two times a day (BID) | ORAL | Status: DC

## 2013-09-05 MED ORDER — SODIUM CHLORIDE 0.9 % IV BOLUS (SEPSIS)
1000.0000 mL | Freq: Once | INTRAVENOUS | Status: AC
  Administered 2013-09-05: 1000 mL via INTRAVENOUS

## 2013-09-05 MED ORDER — ENOXAPARIN SODIUM 60 MG/0.6ML ~~LOC~~ SOLN
60.0000 mg | SUBCUTANEOUS | Status: DC
  Administered 2013-09-05: 60 mg via SUBCUTANEOUS
  Filled 2013-09-05 (×3): qty 0.6

## 2013-09-05 MED ORDER — GABAPENTIN 100 MG PO CAPS
200.0000 mg | ORAL_CAPSULE | Freq: Two times a day (BID) | ORAL | Status: DC
  Administered 2013-09-05 – 2013-09-06 (×2): 200 mg via ORAL
  Filled 2013-09-05 (×4): qty 2

## 2013-09-05 NOTE — Progress Notes (Signed)
Inpatient Diabetes Program Recommendations  AACE/ADA: New Consensus Statement on Inpatient Glycemic Control (2013)  Target Ranges:  Prepandial:   less than 140 mg/dL      Peak postprandial:   less than 180 mg/dL (1-2 hours)      Critically ill patients:  140 - 180 mg/dL  Results for EVART, MCDONNELL (MRN [id number]) as of 09/05/2013 12:21  Ref. Range 09/04/2013 18:00 09/04/2013 20:37 09/05/2013 07:44 09/05/2013 11:49  Glucose-Capillary Latest Range: 70-99 mg/dL 242 (H) 207 (H) 211 (H) 198 (H)   Inpatient Diabetes Program Recommendations Insulin - Basal: Consider adding low dose basal Lantus 10 units  HgbA1C: order to assess prehospital glucose control  Thank you  Raoul Pitch BSN, RN,CDE Inpatient Diabetes Coordinator [phone] (team pager)

## 2013-09-05 NOTE — Progress Notes (Signed)
Routinely checking pts bp and pressure is 74/40 manually. Pt asymptomatic but pt did complain that while in the stress echo test, he became very dizzy. He is not dizzy anymore and denies any pain. Dr. Tana Coast notified and orders received for 1 L bolus. Will closely monitor and pt and wife updated.

## 2013-09-05 NOTE — Consult Note (Signed)
Primary cardiologist: None  HPI: 58 yo male with no prior cardiac history for evaluation of chest pain. Patient typically has dyspnea with moderate activities but not routine activities. No orthopnea, PND, pedal edema, exertional chest pain or syncope. Yesterday morning he awoke with numbness in the first 4 digits of his left upper extremity. There was no loss of strength or sensation. No dysarthria. He then developed mild chest pressure that did not radiate. Not pleuritic or positional. No associated symptoms. His pain lasted for approximately 2 hours and resolved. He has been admitted and cardiology asked to evaluate.    Medications Prior to Admission  Medication Sig Dispense Refill  . acetaminophen (TYLENOL) 325 MG tablet Take 325-975 mg by mouth every 6 (six) hours as needed for moderate pain.      Marland Kitchen aspirin 81 MG tablet Take 81 mg by mouth daily.      . Glucosamine-Chondroit-Vit C-Mn (GLUCOSAMINE 1500 COMPLEX PO) Take 3,000 mg by mouth daily.      . hydrochlorothiazide (HYDRODIURIL) 25 MG tablet Take 25 mg by mouth daily.      Marland Kitchen lisinopril (PRINIVIL,ZESTRIL) 20 MG tablet Take 20 mg by mouth daily.      . metFORMIN (GLUCOPHAGE) 1000 MG tablet Take 1,000 mg by mouth 2 (two) times daily.      . Multiple Vitamins-Minerals (MULTIVITAMIN PO) Take 1 tablet by mouth daily.      Marland Kitchen neomycin-bacitracin-polymyxin (NEOSPORIN) ointment Apply 1 application topically daily as needed for wound care.      Marland Kitchen omeprazole (PRILOSEC) 20 MG capsule Take 20 mg by mouth 2 (two) times daily.      . simvastatin (ZOCOR) 40 MG tablet Take 40 mg by mouth daily.      . traMADol (ULTRAM) 50 MG tablet Take 50 mg by mouth 2 (two) times daily as needed for moderate pain.         Allergies  Allergen Reactions  . Tikosyn [Dofetilide] Other (See Comments)    Unknown reaction     Past Medical History  Diagnosis Date  . Diabetes mellitus without complication   . Hypertension   . Acid reflux   . High cholesterol     . Dyslipidemia   . RA (rheumatoid arthritis)     History reviewed. No pertinent past surgical history.  History   Social History  . Marital Status: Married    Spouse Name: N/A    Number of Children: N/A  . Years of Education: N/A   Occupational History  . Not on file.   Social History Main Topics  . Smoking status: Former Smoker -- 2.00 packs/day for 20 years    Types: Cigarettes  . Smokeless tobacco: Not on file  . Alcohol Use: No  . Drug Use: No  . Sexual Activity: Not on file   Other Topics Concern  . Not on file   Social History Narrative  . No narrative on file    Family History  Problem Relation Age of Onset  . Heart attack Mother   . Stroke Mother   . Heart attack Other     ROS:  Some arthralgias but no fevers or chills, productive cough, hemoptysis, dysphasia, odynophagia, melena, hematochezia, dysuria, hematuria, rash, seizure activity, orthopnea, PND, pedal edema, claudication. Remaining systems are negative.  Physical Exam:   Blood pressure 102/61, pulse 84, temperature 97.8 F (36.6 C), temperature source Oral, resp. rate 18, height $RemoveBe'5\' 7"'LwtcssOvh$  (1.702 m), weight 258 lb (117.028 kg), SpO2 99.00%.  General:  Well developed/Obese in NAD Skin warm/dry Patient not depressed No peripheral clubbing Back-normal HEENT-normal/normal eyelids Neck supple/normal carotid upstroke bilaterally; no bruits; no JVD; no thyromegaly chest - CTA/ normal expansion CV - RRR/normal S1 and S2; no murmurs, rubs or gallops;  PMI nondisplaced Abdomen -NT/ND, no HSM, no mass, + bowel sounds, no bruit, Umbilical hernia 2+ femoral pulses, no bruits Ext-no edema, chords, 1+ DP Neuro-grossly nonfocal  ECG Normal sinus rhythm, left anterior fascicular block, no ST changes.  Results for orders placed during the hospital encounter of 09/04/13 (from the past 48 hour(s))  CBC     Status: None   Collection Time    09/04/13  1:08 PM      Result Value Ref Range   WBC 6.7  4.0 - 10.5  K/uL   RBC 5.14  4.22 - 5.81 MIL/uL   Hemoglobin 15.4  13.0 - 17.0 g/dL   HCT 43.9  39.0 - 52.0 %   MCV 85.4  78.0 - 100.0 fL   MCH 30.0  26.0 - 34.0 pg   MCHC 35.1  30.0 - 36.0 g/dL   RDW 12.9  11.5 - 15.5 %   Platelets 224  150 - 400 K/uL  COMPREHENSIVE METABOLIC PANEL     Status: Abnormal   Collection Time    09/04/13  1:08 PM      Result Value Ref Range   Sodium 139  137 - 147 mEq/L   Potassium 4.7  3.7 - 5.3 mEq/L   Chloride 95 (*) 96 - 112 mEq/L   CO2 29  19 - 32 mEq/L   Glucose, Bld 260 (*) 70 - 99 mg/dL   BUN 17  6 - 23 mg/dL   Creatinine, Ser 0.71  0.50 - 1.35 mg/dL   Calcium 10.0  8.4 - 10.5 mg/dL   Total Protein 7.5  6.0 - 8.3 g/dL   Albumin 4.0  3.5 - 5.2 g/dL   AST 37  0 - 37 U/L   ALT 38  0 - 53 U/L   Alkaline Phosphatase 127 (*) 39 - 117 U/L   Total Bilirubin 0.6  0.3 - 1.2 mg/dL   GFR calc non Af Amer >90  >90 mL/min   GFR calc Af Amer >90  >90 mL/min   Comment: (NOTE)     The eGFR has been calculated using the CKD EPI equation.     This calculation has not been validated in all clinical situations.     eGFR's persistently <90 mL/min signify possible Chronic Kidney     Disease.  URINALYSIS, ROUTINE W REFLEX MICROSCOPIC     Status: Abnormal   Collection Time    09/04/13  1:25 PM      Result Value Ref Range   Color, Urine YELLOW  YELLOW   APPearance CLEAR  CLEAR   Specific Gravity, Urine 1.032 (*) 1.005 - 1.030   pH 5.0  5.0 - 8.0   Glucose, UA >1000 (*) NEGATIVE mg/dL   Hgb urine dipstick NEGATIVE  NEGATIVE   Bilirubin Urine NEGATIVE  NEGATIVE   Ketones, ur NEGATIVE  NEGATIVE mg/dL   Protein, ur NEGATIVE  NEGATIVE mg/dL   Urobilinogen, UA 0.2  0.0 - 1.0 mg/dL   Nitrite NEGATIVE  NEGATIVE   Leukocytes, UA NEGATIVE  NEGATIVE  URINE MICROSCOPIC-ADD ON     Status: None   Collection Time    09/04/13  1:25 PM      Result Value Ref Range   Squamous Epithelial / LPF  RARE  RARE   WBC, UA 0-2  <3 WBC/hpf   Bacteria, UA RARE  RARE  I-STAT TROPOININ, ED      Status: None   Collection Time    09/04/13  1:47 PM      Result Value Ref Range   Troponin i, poc 0.00  0.00 - 0.08 ng/mL   Comment 3            Comment: Due to the release kinetics of cTnI,     a negative result within the first hours     of the onset of symptoms does not rule out     myocardial infarction with certainty.     If myocardial infarction is still suspected,     repeat the test at appropriate intervals.  Rosezena Sensor, ED     Status: None   Collection Time    09/04/13  3:11 PM      Result Value Ref Range   Troponin i, poc 0.00  0.00 - 0.08 ng/mL   Comment 3            Comment: Due to the release kinetics of cTnI,     a negative result within the first hours     of the onset of symptoms does not rule out     myocardial infarction with certainty.     If myocardial infarction is still suspected,     repeat the test at appropriate intervals.  GLUCOSE, CAPILLARY     Status: Abnormal   Collection Time    09/04/13  6:00 PM      Result Value Ref Range   Glucose-Capillary 242 (*) 70 - 99 mg/dL  TROPONIN I     Status: None   Collection Time    09/04/13  7:45 PM      Result Value Ref Range   Troponin I <0.30  <0.30 ng/mL   Comment:            Due to the release kinetics of cTnI,     a negative result within the first hours     of the onset of symptoms does not rule out     myocardial infarction with certainty.     If myocardial infarction is still suspected,     repeat the test at appropriate intervals.  GLUCOSE, CAPILLARY     Status: Abnormal   Collection Time    09/04/13  8:37 PM      Result Value Ref Range   Glucose-Capillary 207 (*) 70 - 99 mg/dL  TROPONIN I     Status: None   Collection Time    09/05/13  1:55 AM      Result Value Ref Range   Troponin I <0.30  <0.30 ng/mL   Comment:            Due to the release kinetics of cTnI,     a negative result within the first hours     of the onset of symptoms does not rule out     myocardial infarction with  certainty.     If myocardial infarction is still suspected,     repeat the test at appropriate intervals.  GLUCOSE, CAPILLARY     Status: Abnormal   Collection Time    09/05/13  7:44 AM      Result Value Ref Range   Glucose-Capillary 211 (*) 70 - 99 mg/dL   Comment 1 Documented in Chart     Comment 2 Notify  RN      Dg Chest 2 View  09/04/2013   CLINICAL DATA:  Chest pain.  EXAM: CHEST  2 VIEW  COMPARISON:  None.  FINDINGS: Mediastinum and hilar structures normal. Mild infiltrate present left lung base. Followup PA and lateral chest x-ray should be obtained to demonstrate clearing and the absence of a mass lesion. Heart size normal. No acute bony abnormality identified 2  IMPRESSION: Ill-defined density in the left lung base. This may represent a small pulmonary infiltrate. Close follow-up chest x-rays should be obtained to demonstrate clearing to exclude underlying mass lesion.   Electronically Signed   By: Marcello Moores  Register   On: 09/04/2013 13:56   Mr Brain Wo Contrast  09/04/2013   CLINICAL DATA:  Numbness left hand and fingers. Hyperlipidemia, hypertension, diabetes, rheumatoid arthritis  EXAM: MRI HEAD WITHOUT CONTRAST  TECHNIQUE: Multiplanar, multiecho pulse sequences of the brain and surrounding structures were obtained without intravenous contrast.  COMPARISON:  None.  FINDINGS: Ventricle size is normal. Cerebral volume is normal for age. Craniocervical junction is normal. Pituitary normal in size.  Sclerotic appearing bones throughout the skull, mandible and upper cervical spine. Question metabolic bone disorder. No focal bony mass lesion identified.  Negative for acute or chronic infarct. Negative for demyelinating disease. Cerebral white matter is normal. Brainstem and cerebellum are normal.  Negative for hemorrhage or mass lesion.  Mild mucosal edema in the paranasal sinuses.  IMPRESSION: No significant abnormality of the brain.  Diffusely sclerotic bones, question metabolic bone disease    Electronically Signed   By: Franchot Gallo M.D.   On: 09/04/2013 19:53    Assessment/Plan 1 chest pain-symptoms atypical. Electrocardiogram with no ST changes. Enzymes negative. Will plan risk stratification with stress echocardiogram. 2 Hypertension-blood pressure controlled. Continue present medications.  3 diabetes mellitus-management per primary care. 4 abnormal chest x-ray-this will need followup with his primary care physician.  5 numbness in left upper extremities-evaluation of her primary care. If stress echocardiogram normal patient will not require cardiac followup.  Kirk Ruths MD 09/05/2013, 8:42 AM

## 2013-09-05 NOTE — Progress Notes (Signed)
  Echocardiogram Echocardiogram Stress Test has been performed.  Daniah Zaldivar L Berlene Dixson 09/05/2013, 1:29 PM

## 2013-09-05 NOTE — Progress Notes (Addendum)
Patient ID: Hunter Clarke  male  ERD:[id number]    DOB: [birth year]    DOA: 09/04/2013  PCP: Barbette Merino, MD  Addendum: Stress echo and echo reviewed : normal   D/w patient, feels dizzy after stress echo, BP 74/40, hold lisinopril and HCTZ, will give 1Liter fluid bolus. Had already received BP meds this AM.  Also, still having left fingers numbness, requested neuro consult, d/w Dr Doy Mince. MRI neg for ac CVA.   Joss Friedel Krystal Eaton M.D. [facility] 09/05/2013, 3:15 PM  Pager: [phone]      Assessment/Plan: Principal Problem:   Chest pain: Somewhat atypical however patient has high risk factors of the strong family history of CAD, hypertension, diabetes - Troponins were negative, EKG showed no acute ST-T wave changes - Cardiology consulted, recommended as stress echo today, if negative, patient will not require any further cardiac workup - Continue aspirin continue PPI  Active Problems:  Numbness in the left hand/fingers: Denies any weakness in the left hand or other focal neurological deficits - MRI of the brain was done which was completely negative for any acute CVA, question neuropathy, will place on trial of gabapentin    DIABETES MELLITUS, TYPE II:  - Continue sliding scale insulin, CBG's uncontrolled     HYPERLIPIDEMIA - Continue statin    HYPERTENSION - Continue lisinopril, HCTZ    RA (rheumatoid arthritis) - Not on any meds   incidental chest x-ray abnormality - Chest x-ray showed ill-defined density in the left lung base, may represent a small pulmonary infiltrate, recommend close followup chest x-ray to demonstrate clearing to exclude underlying mass lesion. Former smoker. Needs further evaluation. No fevers or leukocytosis or URI symptoms.   DVT Prophylaxis:  Code Status:  Family Communication:  Disposition:Stress echo, possible DC if normal   Consultants:  Cardiology   Procedures:  Stress echocardiogram    Antibiotics:  None    Subjective: No active chest pain, states some pressure. Numbness in the first 4 fingers of left hand improving   Objective: Weight change:   Intake/Output Summary (Last 24 hours) at 09/05/13 1109 Last data filed at 09/05/13 0606  Gross per 24 hour  Intake    355 ml  Output    450 ml  Net    -95 ml   Blood pressure 102/61, pulse 84, temperature 97.8 F (36.6 C), temperature source Oral, resp. rate 18, height 5\' 7"  (1.702 m), weight 117.028 kg (258 lb), SpO2 99.00%.  Physical Exam: General: Alert and awake, oriented x3, not in any acute distress. CVS: S1-S2 clear, no murmur rubs or gallops Chest: clear to auscultation bilaterally, no wheezing, rales or rhonchi Abdomen: soft nontender, nondistended, normal bowel sounds  Extremities: no cyanosis, clubbing or edema noted bilaterally Neuro: Cranial nerves II-XII intact, no focal neurological deficits  Lab Results: Basic Metabolic Panel:  Recent Labs Lab 09/04/13 1308  NA 139  K 4.7  CL 95*  CO2 29  GLUCOSE 260*  BUN 17  CREATININE 0.71  CALCIUM 10.0   Liver Function Tests:  Recent Labs Lab 09/04/13 1308  AST 37  ALT 38  ALKPHOS 127*  BILITOT 0.6  PROT 7.5  ALBUMIN 4.0   No results found for this basename: LIPASE, AMYLASE,  in the last 168 hours No results found for this basename: AMMONIA,  in the last 168 hours CBC:  Recent Labs Lab 09/04/13 1308  WBC 6.7  HGB 15.4  HCT 43.9  MCV 85.4  PLT 224   Cardiac Enzymes:  Recent  Labs Lab 09/04/13 1945 09/05/13 0155  TROPONINI <0.30 <0.30   BNP: No components found with this basename: POCBNP,  CBG:  Recent Labs Lab 09/04/13 1800 09/04/13 2037 09/05/13 0744  GLUCAP 242* 207* 211*     Micro Results: No results found for this or any previous visit (from the past 240 hour(s)).  Studies/Results: Dg Chest 2 View  09/04/2013   CLINICAL DATA:  Chest pain.  EXAM: CHEST  2 VIEW  COMPARISON:  None.  FINDINGS: Mediastinum  and hilar structures normal. Mild infiltrate present left lung base. Followup PA and lateral chest x-ray should be obtained to demonstrate clearing and the absence of a mass lesion. Heart size normal. No acute bony abnormality identified 2  IMPRESSION: Ill-defined density in the left lung base. This may represent a small pulmonary infiltrate. Close follow-up chest x-rays should be obtained to demonstrate clearing to exclude underlying mass lesion.   Electronically Signed   By: Marcello Moores  Register   On: 09/04/2013 13:56   Mr Brain Wo Contrast  09/04/2013   CLINICAL DATA:  Numbness left hand and fingers. Hyperlipidemia, hypertension, diabetes, rheumatoid arthritis  EXAM: MRI HEAD WITHOUT CONTRAST  TECHNIQUE: Multiplanar, multiecho pulse sequences of the brain and surrounding structures were obtained without intravenous contrast.  COMPARISON:  None.  FINDINGS: Ventricle size is normal. Cerebral volume is normal for age. Craniocervical junction is normal. Pituitary normal in size.  Sclerotic appearing bones throughout the skull, mandible and upper cervical spine. Question metabolic bone disorder. No focal bony mass lesion identified.  Negative for acute or chronic infarct. Negative for demyelinating disease. Cerebral white matter is normal. Brainstem and cerebellum are normal.  Negative for hemorrhage or mass lesion.  Mild mucosal edema in the paranasal sinuses.  IMPRESSION: No significant abnormality of the brain.  Diffusely sclerotic bones, question metabolic bone disease   Electronically Signed   By: Franchot Gallo M.D.   On: 09/04/2013 19:53    Medications: Scheduled Meds: . aspirin EC  325 mg Oral Daily  . enoxaparin (LOVENOX) injection  60 mg Subcutaneous Q24H  . gabapentin  200 mg Oral BID  . hydrochlorothiazide  25 mg Oral Daily  . insulin aspart  0-5 Units Subcutaneous QHS  . insulin aspart  0-9 Units Subcutaneous TID WC  . lisinopril  20 mg Oral Daily  . pantoprazole  40 mg Oral BID AC  .  simvastatin  40 mg Oral QHS  . sodium chloride  3 mL Intravenous Q12H      LOS: 1 day   Kaydn Kumpf Krystal Eaton M.D. [facility] 09/05/2013, 11:09 AM Pager: [phone]  If 7PM-7AM, please contact night-coverage [email or web address] Password [email or web address]  **Disclaimer: This note was dictated with voice recognition software. Similar sounding words can inadvertently be transcribed and this note may contain transcription errors which may not have been corrected upon publication of note.**

## 2013-09-05 NOTE — Progress Notes (Signed)
UR completed 

## 2013-09-05 NOTE — Consult Note (Signed)
NEURO HOSPITALIST CONSULT NOTE    Reason for Consult:decreased sensation in the left hand  HPI:                                                                                                                                          Hunter Clarke is an 58 y.o. male who went to sleep last night at 11 PM. HE awoke at 11:50 and noted his left ring, middle, index and thumb had decreased sensation along with some chest discomfort.  Over the last 24 hours he has noted the sensation return in his ring finger and continue to return in middle, index and thumb.  He describes the sensation as "if you fell asleep on your hand but usually it returns in 5 minutes, this time it did not return as quickly."  MRI brain was obtained while in hospital and showed no acute infarct.   Past Medical History  Diagnosis Date  . Diabetes mellitus without complication   . Hypertension   . GERD (gastroesophageal reflux disease)   . Dyslipidemia   . RA (rheumatoid arthritis)     Past Surgical History  Procedure Laterality Date  . Tonsillectomy    . Urethral stricture dilatation      Family History  Problem Relation Age of Onset  . Heart attack Mother     MI at age 33  . Stroke Mother   . Heart attack Other      Social History:  reports that he has quit smoking. His smoking use included Cigarettes. He has a 40 pack-year smoking history. He does not have any smokeless tobacco history on file. He reports that he does not drink alcohol or use illicit drugs.  Allergies  Allergen Reactions  . Tikosyn [Dofetilide] Other (See Comments)    Unknown reaction     MEDICATIONS:                                                                                                                     Prior to Admission:  Prescriptions prior to admission  Medication Sig Dispense Refill  . acetaminophen (TYLENOL) 325 MG tablet Take 325-975 mg by mouth every 6 (six) hours as needed for moderate pain.       Marland Kitchen aspirin 81 MG  tablet Take 81 mg by mouth daily.      . Glucosamine-Chondroit-Vit C-Mn (GLUCOSAMINE 1500 COMPLEX PO) Take 3,000 mg by mouth daily.      . hydrochlorothiazide (HYDRODIURIL) 25 MG tablet Take 25 mg by mouth daily.      Marland Kitchen lisinopril (PRINIVIL,ZESTRIL) 20 MG tablet Take 20 mg by mouth daily.      . metFORMIN (GLUCOPHAGE) 1000 MG tablet Take 1,000 mg by mouth 2 (two) times daily.      . Multiple Vitamins-Minerals (MULTIVITAMIN PO) Take 1 tablet by mouth daily.      Marland Kitchen neomycin-bacitracin-polymyxin (NEOSPORIN) ointment Apply 1 application topically daily as needed for wound care.      Marland Kitchen omeprazole (PRILOSEC) 20 MG capsule Take 20 mg by mouth 2 (two) times daily.      . simvastatin (ZOCOR) 40 MG tablet Take 40 mg by mouth daily.      . traMADol (ULTRAM) 50 MG tablet Take 50 mg by mouth 2 (two) times daily as needed for moderate pain.        Scheduled: . aspirin EC  325 mg Oral Daily  . enoxaparin (LOVENOX) injection  60 mg Subcutaneous Q24H  . gabapentin  200 mg Oral BID  . insulin aspart  0-5 Units Subcutaneous QHS  . insulin aspart  0-9 Units Subcutaneous TID WC  . pantoprazole  40 mg Oral BID AC  . simvastatin  40 mg Oral QHS  . sodium chloride  3 mL Intravenous Q12H     ROS:                                                                                                                                       History obtained from the patient  General ROS: negative for - chills, fatigue, fever, night sweats, weight gain or weight loss Psychological ROS: negative for - behavioral disorder, hallucinations, memory difficulties, mood swings or suicidal ideation Ophthalmic ROS: negative for - blurry vision, double vision, eye pain or loss of vision ENT ROS: negative for - epistaxis, nasal discharge, oral lesions, sore throat, tinnitus or vertigo Allergy and Immunology ROS: negative for - hives or itchy/watery eyes Hematological and Lymphatic ROS: negative for - bleeding  problems, bruising or swollen lymph nodes Endocrine ROS: negative for - galactorrhea, hair pattern changes, polydipsia/polyuria or temperature intolerance Respiratory ROS: negative for - cough, hemoptysis, shortness of breath or wheezing Cardiovascular ROS: negative for - chest pain, dyspnea on exertion, edema or irregular heartbeat Gastrointestinal ROS: negative for - abdominal pain, diarrhea, hematemesis, nausea/vomiting or stool incontinence Genito-Urinary ROS: negative for - dysuria, hematuria, incontinence or urinary frequency/urgency Musculoskeletal ROS: negative for - joint swelling or muscular weakness Neurological ROS: as noted in HPI Dermatological ROS: negative for rash and skin lesion changes   Blood pressure 83/43, pulse 97, temperature 97.6 F (36.4 C), temperature source Oral, resp. rate 16, height 5\' 7"  (1.702 m), weight 117.028 kg (  258 lb), SpO2 97.00%.   Neurologic Examination:                                                                                                      Mental Status: Alert, oriented, thought content appropriate.  Speech fluent without evidence of aphasia.  Able to follow 3 step commands without difficulty. Cranial Nerves: II: Discs flat bilaterally; Visual fields grossly normal, pupils equal, round, reactive to light and accommodation III,IV, VI: ptosis not present, extra-ocular motions intact bilaterally V,VII: smile symmetric, facial light touch sensation normal bilaterally VIII: hearing normal bilaterally IX,X: gag reflex present XI: bilateral shoulder shrug XII: midline tongue extension without atrophy or fasciculations  Motor: Right : Upper extremity   5/5    Left:     Upper extremity   5/5  Lower extremity   5/5     Lower extremity   5/5 Tone and bulk:normal tone throughout; no atrophy noted Sensory: he has decreased sensation in the left hand in the thumb, index finger and middle finger. HE also shows decreased temperature and vibratory  sensation in bilateral feet.  Deep Tendon Reflexes:  Right: Upper Extremity   Left: Upper extremity   biceps (C-5 to C-6) 2/4   biceps (C-5 to C-6) 2/4 tricep (C7) 2/4    triceps (C7) 2/4 Brachioradialis (C6) 2/4  Brachioradialis (C6) 2/4  Lower Extremity Lower Extremity  quadriceps (L-2 to L-4) 2/4   quadriceps (L-2 to L-4) 2/4 Achilles (S1) 0/4   Achilles (S1) 0/4  Plantars: Right: downgoing   Left: downgoing Cerebellar: normal finger-to-nose,  normal heel-to-shin test Gait: not tested CV: pulses palpable throughout    Lab Results: Basic Metabolic Panel:  Recent Labs Lab 09/04/13 1308  NA 139  K 4.7  CL 95*  CO2 29  GLUCOSE 260*  BUN 17  CREATININE 0.71  CALCIUM 10.0    Liver Function Tests:  Recent Labs Lab 09/04/13 1308  AST 37  ALT 38  ALKPHOS 127*  BILITOT 0.6  PROT 7.5  ALBUMIN 4.0   No results found for this basename: LIPASE, AMYLASE,  in the last 168 hours No results found for this basename: AMMONIA,  in the last 168 hours  CBC:  Recent Labs Lab 09/04/13 1308  WBC 6.7  HGB 15.4  HCT 43.9  MCV 85.4  PLT 224    Cardiac Enzymes:  Recent Labs Lab 09/04/13 1945 09/05/13 0155  TROPONINI <0.30 <0.30    Lipid Panel: No results found for this basename: CHOL, TRIG, HDL, CHOLHDL, VLDL, LDLCALC,  in the last 168 hours  CBG:  Recent Labs Lab 09/04/13 1800 09/04/13 2037 09/05/13 0744 09/05/13 1149 09/05/13 1635  GLUCAP 242* 207* 211* 198* 283*    Microbiology: No results found for this or any previous visit.  Coagulation Studies: No results found for this basename: LABPROT, INR,  in the last 72 hours  Imaging: Dg Chest 2 View  09/04/2013   CLINICAL DATA:  Chest pain.  EXAM: CHEST  2 VIEW  COMPARISON:  None.  FINDINGS: Mediastinum and hilar structures normal. Mild infiltrate  present left lung base. Followup PA and lateral chest x-ray should be obtained to demonstrate clearing and the absence of a mass lesion. Heart size normal.  No acute bony abnormality identified 2  IMPRESSION: Ill-defined density in the left lung base. This may represent a small pulmonary infiltrate. Close follow-up chest x-rays should be obtained to demonstrate clearing to exclude underlying mass lesion.   Electronically Signed   By: Marcello Moores  Register   On: 09/04/2013 13:56   Mr Brain Wo Contrast  09/04/2013   CLINICAL DATA:  Numbness left hand and fingers. Hyperlipidemia, hypertension, diabetes, rheumatoid arthritis  EXAM: MRI HEAD WITHOUT CONTRAST  TECHNIQUE: Multiplanar, multiecho pulse sequences of the brain and surrounding structures were obtained without intravenous contrast.  COMPARISON:  None.  FINDINGS: Ventricle size is normal. Cerebral volume is normal for age. Craniocervical junction is normal. Pituitary normal in size.  Sclerotic appearing bones throughout the skull, mandible and upper cervical spine. Question metabolic bone disorder. No focal bony mass lesion identified.  Negative for acute or chronic infarct. Negative for demyelinating disease. Cerebral white matter is normal. Brainstem and cerebellum are normal.  Negative for hemorrhage or mass lesion.  Mild mucosal edema in the paranasal sinuses.  IMPRESSION: No significant abnormality of the brain.  Diffusely sclerotic bones, question metabolic bone disease   Electronically Signed   By: Franchot Gallo M.D.   On: 09/04/2013 19:53    Etta Quill PA-C [facility] [phone]  09/05/2013, 5:58 PM  Patient seen and examined.  Clinical course and management discussed.  Necessary edits performed.  I agree with the above.  Assessment and plan of care developed and discussed below.    Assessment/Plan:  58 YO diabetic, male who awakened with decreased sensation in left ring, middle, index and thumb finger. Over the course of 24 hours his symptoms continue to improve and his ring finger has returned to normal.  MRI of the brain has been reviewed and shows no acute changes.  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   Recommendations: 1) No further neurologic intervention is recommended at this time.  If further questions arise, please call or page at that time.  Thank you for allowing neurology to participate in the care of this patient.  If patient continues to have symptoms for longer than 2 weeks after discharge may consider neurological referral and an out patient EMG/NCV to further evaluate symptoms.   Alexis Goodell, MD [facility] [phone] 09/05/2013  6:12 PM

## 2013-09-05 NOTE — Progress Notes (Signed)
Echo Lab  2D Echocardiogram completed.  [place], RDCS 09/05/2013 1:29 PM

## 2013-09-05 NOTE — Progress Notes (Signed)
    Underwent stress ECHO.  Perry Mount PA-C  [facility]

## 2013-09-06 LAB — GLUCOSE, CAPILLARY
Glucose-Capillary: 203 mg/dL — ABNORMAL HIGH (ref 70–99)
Glucose-Capillary: 204 mg/dL — ABNORMAL HIGH (ref 70–99)

## 2013-09-06 MED ORDER — GABAPENTIN 100 MG PO CAPS: 200.0000 mg | ORAL_CAPSULE | Freq: Two times a day (BID) | ORAL | Status: DC

## 2013-09-06 NOTE — Discharge Summary (Signed)
Physician Discharge Summary  Patient ID: Hunter Clarke MRN: [id number] DOB/AGE: 57-Jan-1957 57 y.o.  Admit date: 09/04/2013 Discharge date: 09/06/2013  Primary Care Physician:  Barbette Merino, MD  Discharge Diagnoses:    . Chest pain atypical, resolved  . Numbness of fingers, left hand  . DIABETES MELLITUS, TYPE II . HYPERLIPIDEMIA . HYPERTENSION . GERD . RA (rheumatoid arthritis)  Consults: Cardiology, Dr. Stanford Breed                   Neurology, Dr. Doy Mince     Recommendations for Outpatient Follow-up:  Patient's hemoglobin A1c is 10.4, he will benefit from insulin regimen. He was provided diabetic teaching. Please discuss with the patient.  Chest x-ray showed ill-defined density in the left lung base, may represent a small pulmonary infiltrate, recommend close followup chest x-ray to demonstrate clearing to exclude underlying mass lesion. Former smoker. Needs further evaluation.  Allergies:   Allergies  Allergen Reactions  . Tikosyn [Dofetilide] Other (See Comments)    Unknown reaction      Discharge Medications:   Medication List         acetaminophen 325 MG tablet  Commonly known as:  TYLENOL  Take 325-975 mg by mouth every 6 (six) hours as needed for moderate pain.     aspirin 81 MG tablet  Take 81 mg by mouth daily.     gabapentin 100 MG capsule  Commonly known as:  NEURONTIN  Take 2 capsules (200 mg total) by mouth 2 (two) times daily.     GLUCOSAMINE 1500 COMPLEX PO  Take 3,000 mg by mouth daily.     hydrochlorothiazide 25 MG tablet  Commonly known as:  HYDRODIURIL  Take 25 mg by mouth daily.     lisinopril 20 MG tablet  Commonly known as:  PRINIVIL,ZESTRIL  Take 20 mg by mouth daily.     metFORMIN 1000 MG tablet  Commonly known as:  GLUCOPHAGE  Take 1,000 mg by mouth 2 (two) times daily.     MULTIVITAMIN PO  Take 1 tablet by mouth daily.     neomycin-bacitracin-polymyxin ointment  Commonly known as:  NEOSPORIN  Apply 1 application topically  daily as needed for wound care.     omeprazole 20 MG capsule  Commonly known as:  PRILOSEC  Take 20 mg by mouth 2 (two) times daily.     simvastatin 40 MG tablet  Commonly known as:  ZOCOR  Take 40 mg by mouth daily.     traMADol 50 MG tablet  Commonly known as:  ULTRAM  Take 50 mg by mouth 2 (two) times daily as needed for moderate pain.         Brief H and P: For complete details please refer to admission H and P, but in brief Hunter Clarke is a 58 y.o. male with PMH of DM 2, HTN, dyslipidemia, GERD, former smoker, RA, strong family history of heart disease (a maternal uncle died of MI at age 21 yrs, others with Heart disease), presented to the ED with complaints of chest pain and numbness of left hand fingers. He was in his usual state of health until noon on 09/04/13 when he woke up from a nap with midsternal chest discomfort, rated as mild to moderate in severity, nonradiating, not pleuritic in nature, associated with nausea but no dyspnea or palpitations. No cough, fever or chills. He also complained of numbness of the lateral four fingers of left hand extending up to the wrist. He was sleeping  on a roll of a bed but cannot recollect sleeping in an abnormal position. He denied weakness in the left hand, slurred speech, headache or twisting of face. Patient states that the current episode of chest discomfort is not similar to GERD. He took a baby aspirin and presented to the ED. No history of long distance travel. He stated that he had an episode of chest pain approximately 10 years ago but was told that he did not have a heart attack. Stress test subsequently apparently was negative. In the ED, EKG without acute changes, POC troponin x2 negative   Hospital Course:   Chest pain: Somewhat atypical however patient has high risk factors of the strong family history of CAD, hypertension, diabetes, hence he was admitted to telemetry. Patient was ruled out for acute disease, troponins were  negative, EKG showed no acute ST-T wave changes cardiology was consulted, seen by Dr. Stanford Breed. Patient underwent process a 2-D echo and stress echo. 2-D echo showed EF of 55-60%, normal wall motion, no regional wall motion abnormalities, grade 1 diastolic dysfunction Stress echocardiogram with no chest pain and no ST changes, normal LV function at baseline with no obvious stress induced wall motion abnormalities normal stress echocardiogram.  Numbness in the left hand/fingers: Denied any weakness in the left hand or other focal neurological deficits.  MRI of the brain was done which was completely negative for any acute CVA, question neuropathy, patient was placed on trial of gabapentin. As patient continued to complain of left forefingers decrease sensation, neurology was consulted. 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 If patient continues to have symptoms for longer than 2 weeks after discharge may consider neurological referral and an out patient EMG/NCV to further evaluate symptoms.   DIABETES MELLITUS, TYPE II: Diabetic oral to consult was obtained and patient was placed on insulin while inpatient he was provided diabetic teaching. Hemoglobin A1c is 10.4, patient will benefit from being on insulin and close PCP for followup to monitor his blood sugars outpatient hence will defer to PCP for further management.  HYPERLIPIDEMIA - Continue statin   HYPERTENSION - Continue lisinopril, HCTZ   RA (rheumatoid arthritis) - Not on any meds   incidental chest x-ray abnormality  - Chest x-ray showed ill-defined density in the left lung base, may represent a small pulmonary infiltrate, recommend close followup chest x-ray to demonstrate clearing to exclude underlying mass lesion. Former smoker. Needs further evaluation. No fevers or leukocytosis or URI symptoms.      Day of Discharge BP 123/65  Pulse 88  Temp(Src) 97.4  F (36.3 C) (Oral)  Resp 16  Ht 5\' 7"  (1.702 m)  Wt 116.348 kg (256 lb 8 oz)  BMI 40.16 kg/m2  SpO2 99%  Physical Exam: General: Alert and awake oriented x3 not in any acute distress. HEENT: anicteric sclera, pupils reactive to light and accommodation CVS: S1-S2 clear no murmur rubs or gallops Chest: clear to auscultation bilaterally, no wheezing rales or rhonchi Abdomen: soft nontender, nondistended, normal bowel sounds Extremities: no cyanosis, clubbing or edema noted bilaterally Neuro: Cranial nerves II-XII intact, no focal neurological deficits   The results of significant diagnostics from this hospitalization (including imaging, microbiology, ancillary and laboratory) are listed below for reference.    LAB RESULTS: Basic Metabolic Panel:  Recent Labs Lab 09/04/13 1308  NA 139  K 4.7  CL 95*  CO2 29  GLUCOSE 260*  BUN 17  CREATININE 0.71  CALCIUM 10.0  Liver Function Tests:  Recent Labs Lab 09/04/13 1308  AST 37  ALT 38  ALKPHOS 127*  BILITOT 0.6  PROT 7.5  ALBUMIN 4.0   No results found for this basename: LIPASE, AMYLASE,  in the last 168 hours No results found for this basename: AMMONIA,  in the last 168 hours CBC:  Recent Labs Lab 09/04/13 1308  WBC 6.7  HGB 15.4  HCT 43.9  MCV 85.4  PLT 224   Cardiac Enzymes:  Recent Labs Lab 09/04/13 1945 09/05/13 0155  TROPONINI <0.30 <0.30   BNP: No components found with this basename: POCBNP,  CBG:  Recent Labs Lab 09/06/13 0610 09/06/13 0745  GLUCAP 203* 204*    Significant Diagnostic Studies:  Dg Chest 2 View  09/04/2013   CLINICAL DATA:  Chest pain.  EXAM: CHEST  2 VIEW  COMPARISON:  None.  FINDINGS: Mediastinum and hilar structures normal. Mild infiltrate present left lung base. Followup PA and lateral chest x-ray should be obtained to demonstrate clearing and the absence of a mass lesion. Heart size normal. No acute bony abnormality identified 2  IMPRESSION: Ill-defined density in the  left lung base. This may represent a small pulmonary infiltrate. Close follow-up chest x-rays should be obtained to demonstrate clearing to exclude underlying mass lesion.   Electronically Signed   By: Marcello Moores  Register   On: 09/04/2013 13:56   Mr Brain Wo Contrast  09/04/2013   CLINICAL DATA:  Numbness left hand and fingers. Hyperlipidemia, hypertension, diabetes, rheumatoid arthritis  EXAM: MRI HEAD WITHOUT CONTRAST  TECHNIQUE: Multiplanar, multiecho pulse sequences of the brain and surrounding structures were obtained without intravenous contrast.  COMPARISON:  None.  FINDINGS: Ventricle size is normal. Cerebral volume is normal for age. Craniocervical junction is normal. Pituitary normal in size.  Sclerotic appearing bones throughout the skull, mandible and upper cervical spine. Question metabolic bone disorder. No focal bony mass lesion identified.  Negative for acute or chronic infarct. Negative for demyelinating disease. Cerebral white matter is normal. Brainstem and cerebellum are normal.  Negative for hemorrhage or mass lesion.  Mild mucosal edema in the paranasal sinuses.  IMPRESSION: No significant abnormality of the brain.  Diffusely sclerotic bones, question metabolic bone disease   Electronically Signed   By: Franchot Gallo M.D.   On: 09/04/2013 19:53    2D ECHO: Study Conclusions  Left ventricle: The cavity size was normal. Wall thickness was increased in a pattern of mild LVH. There was mild focal basal hypertrophy of the septum. Systolic function was normal. The estimated ejection fraction was in the range of 55% to 60%. Wall motion was normal; there were no regional wall motion abnormalities. Doppler parameters are consistent with abnormal left ventricular relaxation (grade 1 diastolic dysfunction).   Disposition and Follow-up:     Discharge Orders   Future Orders Complete By Expires   Diet Carb Modified  As directed    Increase activity slowly  As directed         DISPOSITION: Home DIET: Carb modified   DISCHARGE FOLLOW-UP Follow-up Information   Follow up with Barbette Merino, MD. Schedule an appointment as soon as possible for a visit in 10 days. (for hospital follow-up)    Specialty:  Internal Medicine   Contact information:   [place] [id number] [phone]       Time spent on Discharge: 45 minutes  Signed:   Mendel Corning M.D. [facility] 09/06/2013, 9:38 AM Pager: [phone]   **Disclaimer:  This note was dictated with voice recognition software. Similar sounding words can inadvertently be transcribed and this note may contain transcription errors which may not have been corrected upon publication of note.**

## 2015-09-21 DIAGNOSIS — E785 Hyperlipidemia, unspecified: Secondary | ICD-10-CM | POA: Diagnosis not present

## 2015-09-21 DIAGNOSIS — Z130 Encounter for screening for diseases of the blood and blood-forming organs and certain disorders involving the immune mechanism: Secondary | ICD-10-CM | POA: Diagnosis not present

## 2015-09-21 DIAGNOSIS — I10 Essential (primary) hypertension: Secondary | ICD-10-CM | POA: Diagnosis not present

## 2015-09-21 DIAGNOSIS — E119 Type 2 diabetes mellitus without complications: Secondary | ICD-10-CM | POA: Diagnosis not present

## 2015-09-21 DIAGNOSIS — R0683 Snoring: Secondary | ICD-10-CM | POA: Diagnosis not present

## 2015-09-21 DIAGNOSIS — R062 Wheezing: Secondary | ICD-10-CM | POA: Diagnosis not present

## 2015-11-12 DIAGNOSIS — M109 Gout, unspecified: Secondary | ICD-10-CM | POA: Diagnosis not present

## 2015-11-22 DIAGNOSIS — I10 Essential (primary) hypertension: Secondary | ICD-10-CM | POA: Diagnosis not present

## 2015-11-22 DIAGNOSIS — K469 Unspecified abdominal hernia without obstruction or gangrene: Secondary | ICD-10-CM | POA: Diagnosis not present

## 2015-11-22 DIAGNOSIS — E1140 Type 2 diabetes mellitus with diabetic neuropathy, unspecified: Secondary | ICD-10-CM | POA: Diagnosis not present

## 2016-02-02 DIAGNOSIS — K219 Gastro-esophageal reflux disease without esophagitis: Secondary | ICD-10-CM | POA: Diagnosis not present

## 2016-02-02 DIAGNOSIS — E119 Type 2 diabetes mellitus without complications: Secondary | ICD-10-CM | POA: Diagnosis not present

## 2016-02-02 DIAGNOSIS — I10 Essential (primary) hypertension: Secondary | ICD-10-CM | POA: Diagnosis not present

## 2016-02-02 DIAGNOSIS — M19019 Primary osteoarthritis, unspecified shoulder: Secondary | ICD-10-CM | POA: Diagnosis not present

## 2016-02-02 DIAGNOSIS — E785 Hyperlipidemia, unspecified: Secondary | ICD-10-CM | POA: Diagnosis not present

## 2016-02-02 DIAGNOSIS — E039 Hypothyroidism, unspecified: Secondary | ICD-10-CM | POA: Diagnosis not present

## 2016-09-06 DIAGNOSIS — E119 Type 2 diabetes mellitus without complications: Secondary | ICD-10-CM | POA: Diagnosis not present

## 2016-09-06 DIAGNOSIS — E785 Hyperlipidemia, unspecified: Secondary | ICD-10-CM | POA: Diagnosis not present

## 2016-09-06 DIAGNOSIS — I10 Essential (primary) hypertension: Secondary | ICD-10-CM | POA: Diagnosis not present

## 2016-09-07 DIAGNOSIS — I10 Essential (primary) hypertension: Secondary | ICD-10-CM | POA: Diagnosis not present

## 2016-09-07 DIAGNOSIS — E119 Type 2 diabetes mellitus without complications: Secondary | ICD-10-CM | POA: Diagnosis not present

## 2016-09-07 DIAGNOSIS — E785 Hyperlipidemia, unspecified: Secondary | ICD-10-CM | POA: Diagnosis not present

## 2017-01-25 DIAGNOSIS — K429 Umbilical hernia without obstruction or gangrene: Secondary | ICD-10-CM | POA: Diagnosis not present

## 2017-01-25 DIAGNOSIS — Z86010 Personal history of colonic polyps: Secondary | ICD-10-CM | POA: Diagnosis not present

## 2017-01-25 DIAGNOSIS — R194 Change in bowel habit: Secondary | ICD-10-CM | POA: Diagnosis not present

## 2017-01-25 DIAGNOSIS — K625 Hemorrhage of anus and rectum: Secondary | ICD-10-CM | POA: Diagnosis not present

## 2017-01-25 DIAGNOSIS — K439 Ventral hernia without obstruction or gangrene: Secondary | ICD-10-CM | POA: Diagnosis not present

## 2017-01-25 DIAGNOSIS — Z1211 Encounter for screening for malignant neoplasm of colon: Secondary | ICD-10-CM | POA: Diagnosis not present

## 2017-01-25 DIAGNOSIS — K5904 Chronic idiopathic constipation: Secondary | ICD-10-CM | POA: Diagnosis not present

## 2017-01-30 ENCOUNTER — Other Ambulatory Visit: Payer: Self-pay | Admitting: Gastroenterology

## 2017-01-30 DIAGNOSIS — K429 Umbilical hernia without obstruction or gangrene: Secondary | ICD-10-CM

## 2017-01-30 DIAGNOSIS — R740 Nonspecific elevation of levels of transaminase and lactic acid dehydrogenase [LDH]: Secondary | ICD-10-CM | POA: Diagnosis not present

## 2017-01-30 NOTE — Progress Notes (Signed)
Hunter Vickroy MD 

## 2017-02-02 ENCOUNTER — Ambulatory Visit
Admission: RE | Admit: 2017-02-02 | Discharge: 2017-02-02 | Disposition: A | Discharge status: Home / Self Care | Payer: Medicare Other | Source: Ambulatory Visit | Attending: Gastroenterology | Admitting: Gastroenterology

## 2017-02-02 DIAGNOSIS — K429 Umbilical hernia without obstruction or gangrene: Secondary | ICD-10-CM | POA: Diagnosis not present

## 2017-02-02 MED ORDER — IOPAMIDOL (ISOVUE-300) INJECTION 61%
125.0000 mL | Freq: Once | INTRAVENOUS | Status: AC | PRN
  Administered 2017-02-02: 125 mL via INTRAVENOUS

## 2017-02-06 DIAGNOSIS — K4091 Unilateral inguinal hernia, without obstruction or gangrene, recurrent: Secondary | ICD-10-CM | POA: Diagnosis not present

## 2017-02-06 DIAGNOSIS — E119 Type 2 diabetes mellitus without complications: Secondary | ICD-10-CM | POA: Diagnosis not present

## 2017-02-06 DIAGNOSIS — E785 Hyperlipidemia, unspecified: Secondary | ICD-10-CM | POA: Diagnosis not present

## 2017-02-06 DIAGNOSIS — K5900 Constipation, unspecified: Secondary | ICD-10-CM | POA: Diagnosis not present

## 2017-03-19 DIAGNOSIS — K635 Polyp of colon: Secondary | ICD-10-CM | POA: Diagnosis not present

## 2017-03-19 DIAGNOSIS — D127 Benign neoplasm of rectosigmoid junction: Secondary | ICD-10-CM | POA: Diagnosis not present

## 2017-03-19 DIAGNOSIS — K219 Gastro-esophageal reflux disease without esophagitis: Secondary | ICD-10-CM | POA: Diagnosis not present

## 2017-03-19 DIAGNOSIS — Z1211 Encounter for screening for malignant neoplasm of colon: Secondary | ICD-10-CM | POA: Diagnosis not present

## 2017-03-19 DIAGNOSIS — D122 Benign neoplasm of ascending colon: Secondary | ICD-10-CM | POA: Diagnosis not present

## 2017-04-19 DIAGNOSIS — K429 Umbilical hernia without obstruction or gangrene: Secondary | ICD-10-CM | POA: Diagnosis not present

## 2017-04-19 DIAGNOSIS — K5904 Chronic idiopathic constipation: Secondary | ICD-10-CM | POA: Diagnosis not present

## 2017-04-19 DIAGNOSIS — K439 Ventral hernia without obstruction or gangrene: Secondary | ICD-10-CM | POA: Diagnosis not present

## 2017-04-19 DIAGNOSIS — K219 Gastro-esophageal reflux disease without esophagitis: Secondary | ICD-10-CM | POA: Diagnosis not present

## 2019-07-22 DIAGNOSIS — E119 Type 2 diabetes mellitus without complications: Secondary | ICD-10-CM | POA: Diagnosis not present

## 2019-07-22 DIAGNOSIS — I10 Essential (primary) hypertension: Secondary | ICD-10-CM | POA: Diagnosis not present

## 2019-07-22 DIAGNOSIS — E1140 Type 2 diabetes mellitus with diabetic neuropathy, unspecified: Secondary | ICD-10-CM | POA: Diagnosis not present

## 2019-07-22 DIAGNOSIS — Z1329 Encounter for screening for other suspected endocrine disorder: Secondary | ICD-10-CM | POA: Diagnosis not present

## 2019-07-22 DIAGNOSIS — E785 Hyperlipidemia, unspecified: Secondary | ICD-10-CM | POA: Diagnosis not present

## 2020-01-07 DIAGNOSIS — I10 Essential (primary) hypertension: Secondary | ICD-10-CM | POA: Diagnosis not present

## 2020-01-07 DIAGNOSIS — E785 Hyperlipidemia, unspecified: Secondary | ICD-10-CM | POA: Diagnosis not present

## 2020-01-07 DIAGNOSIS — E1165 Type 2 diabetes mellitus with hyperglycemia: Secondary | ICD-10-CM | POA: Diagnosis not present

## 2023-12-21 ENCOUNTER — Other Ambulatory Visit: Payer: Self-pay | Admitting: General Surgery

## 2023-12-21 DIAGNOSIS — K429 Umbilical hernia without obstruction or gangrene: Secondary | ICD-10-CM

## 2023-12-21 DIAGNOSIS — K7581 Nonalcoholic steatohepatitis (NASH): Secondary | ICD-10-CM

## 2023-12-26 ENCOUNTER — Ambulatory Visit
Admission: RE | Admit: 2023-12-26 | Discharge: 2023-12-26 | Disposition: A | Discharge status: Home / Self Care | Source: Ambulatory Visit | Attending: General Surgery | Admitting: General Surgery

## 2023-12-26 DIAGNOSIS — K429 Umbilical hernia without obstruction or gangrene: Secondary | ICD-10-CM

## 2023-12-26 DIAGNOSIS — K7581 Nonalcoholic steatohepatitis (NASH): Secondary | ICD-10-CM

## 2023-12-26 MED ORDER — IOPAMIDOL (ISOVUE-300) INJECTION 61%
100.0000 mL | Freq: Once | INTRAVENOUS | Status: AC | PRN
  Administered 2023-12-26: 100 mL via INTRAVENOUS

## 2024-01-24 NOTE — Progress Notes (Signed)
 Chief Complaint:   Chief Complaint  Patient presents with  . Post Operative Visit    Subjective  Hunter Clarke is a 68 y.o. male established patient in today for: History of Present Illness Hunter Clarke is a 68 year old male with considerable liver disease who presents for evaluation of liver disease and associated complications.  He has a large abdomen, which he attributes to being born with a double hernia, and has not had any prior interventions for the fluid accumulation. He faces challenges with transportation to a liver center for further evaluation, as he is unfamiliar with mapping systems and lacks someone to drive him. His wife's use of a wheelchair limits her ability to assist with transportation.  There is no problem list on file for this patient.   Outpatient Medications Prior to Visit  Medication Sig Dispense Refill  . acetaminophen  (TYLENOL ) 325 MG tablet Take 325-975 mg by mouth    . amLODIPine (NORVASC) 5 MG tablet Take 5 mg by mouth once daily    . aspirin  81 mg tablet Take 81 mg by mouth once daily    . fenofibrate nanocrystallized (TRICOR) 145 MG tablet Take 145 mg by mouth once daily    . glipiZIDE (GLUCOTROL) 10 MG tablet Take 10 mg by mouth 2 (two) times daily before meals    . hydroCHLOROthiazide  (HYDRODIURIL ) 25 MG tablet Take 25 mg by mouth once daily    . LANTUS SOLOSTAR U-100 INSULIN  pen injector (concentration 100 units/mL)     . losartan (COZAAR) 100 MG tablet Take 100 mg by mouth once daily    . metFORMIN (GLUCOPHAGE) 1000 MG tablet TAKE 1 TABLET BY MOUTH IN THE MORNING AND 1 TABLET BY MOUTH IN THE EVENING FOR DIABETES    . OZEMPIC 0.25 mg or 0.5 mg (2 mg/3 mL) pen injector INJECT 0.25 MG ONCE A WEEK FOR DIABETES CONTROL FOR 4 WEEKS. THEN INJECT 0.5MG  WEEKLY.    . pantoprazole  (PROTONIX ) 40 MG DR tablet Take 40 mg by mouth once daily    . simvastatin  (ZOCOR ) 40 MG tablet Take 40 mg by mouth at bedtime    . traMADoL  (ULTRAM ) 50 mg tablet TAKE 1 TABLET  BY MOUTH EVERY 6 HOURS (UP TO 4 TIMES DAILY) AS NEEDED.     No facility-administered medications prior to visit.       Objective  Vitals:   01/24/24 1510  PainSc: 0-No pain   There is no height or weight on file to calculate BMI. Physical Exam Constitutional:      Appearance: Normal appearance.  HENT:     Head: Normocephalic and atraumatic.  Pulmonary:     Effort: Pulmonary effort is normal.  Abdominal:     Comments: Moderate umbilical hernia fluid filled  Musculoskeletal:        General: Normal range of motion.     Cervical back: Normal range of motion.  Neurological:     General: No focal deficit present.     Mental Status: He is alert and oriented to person, place, and time. Mental status is at baseline.  Psychiatric:        Mood and Affect: Mood normal.        Behavior: Behavior normal.        Thought Content: Thought content normal.      I reviewed CT images showing ascites and fibrotic changes of the liver with moderate umbilical hernia  Assessment/Plan:   Assessment & Plan Cirrhosis due to nonalcoholic fatty liver disease with  ascites Considerable liver disease with fluid accumulation on CT scan. Significant surgical risks due to severity. - Refer to a liver center for evaluation and management.  Umbilical hernia without obstruction or gangrene Umbilical hernia complicated by liver disease affecting surgical risk. - Discuss hernia management with the liver center to determine surgical options based on liver function. Diagnoses and all orders for this visit:  Umbilical hernia without obstruction and without gangrene -     Ambulatory Referral to Transplant-Liver/Small Bowel  NASH (nonalcoholic steatohepatitis) -     Ambulatory Referral to Transplant-Liver/Small Bowel  Severe obesity (BMI >= 40) (CMS/HHS-HCC) -     Ambulatory Referral to Transplant-Liver/Small Bowel  Cirrhosis of liver with ascites, unspecified hepatic cirrhosis type  (CMS/HHS-HCC) -      Ambulatory Referral to Transplant-Liver/Small Bowel      This note has been created using automated tools and reviewed for accuracy by [employer].

## 2024-02-15 ENCOUNTER — Inpatient Hospital Stay (HOSPITAL_COMMUNITY)
Admission: EM | Admit: 2024-02-15 | Discharge: 2024-02-17 | DRG: 432 | Disposition: A | Discharge status: Home / Self Care | Attending: Internal Medicine | Admitting: Internal Medicine

## 2024-02-15 ENCOUNTER — Emergency Department (HOSPITAL_COMMUNITY)

## 2024-02-15 ENCOUNTER — Other Ambulatory Visit: Payer: Self-pay

## 2024-02-15 ENCOUNTER — Encounter (HOSPITAL_COMMUNITY): Payer: Self-pay | Admitting: Student

## 2024-02-15 DIAGNOSIS — Z6841 Body Mass Index (BMI) 40.0 and over, adult: Secondary | ICD-10-CM

## 2024-02-15 DIAGNOSIS — E66813 Obesity, class 3: Secondary | ICD-10-CM | POA: Diagnosis present

## 2024-02-15 DIAGNOSIS — Z79899 Other long term (current) drug therapy: Secondary | ICD-10-CM

## 2024-02-15 DIAGNOSIS — Z8614 Personal history of Methicillin resistant Staphylococcus aureus infection: Secondary | ICD-10-CM

## 2024-02-15 DIAGNOSIS — Z794 Long term (current) use of insulin: Secondary | ICD-10-CM

## 2024-02-15 DIAGNOSIS — E782 Mixed hyperlipidemia: Secondary | ICD-10-CM | POA: Diagnosis present

## 2024-02-15 DIAGNOSIS — E1165 Type 2 diabetes mellitus with hyperglycemia: Secondary | ICD-10-CM | POA: Diagnosis present

## 2024-02-15 DIAGNOSIS — R188 Other ascites: Secondary | ICD-10-CM | POA: Diagnosis present

## 2024-02-15 DIAGNOSIS — K7581 Nonalcoholic steatohepatitis (NASH): Secondary | ICD-10-CM | POA: Diagnosis present

## 2024-02-15 DIAGNOSIS — I10 Essential (primary) hypertension: Secondary | ICD-10-CM | POA: Diagnosis present

## 2024-02-15 DIAGNOSIS — Z87891 Personal history of nicotine dependence: Secondary | ICD-10-CM

## 2024-02-15 DIAGNOSIS — K7469 Other cirrhosis of liver: Principal | ICD-10-CM | POA: Diagnosis present

## 2024-02-15 DIAGNOSIS — K767 Hepatorenal syndrome: Secondary | ICD-10-CM | POA: Diagnosis present

## 2024-02-15 DIAGNOSIS — Z888 Allergy status to other drugs, medicaments and biological substances status: Secondary | ICD-10-CM

## 2024-02-15 DIAGNOSIS — Z8249 Family history of ischemic heart disease and other diseases of the circulatory system: Secondary | ICD-10-CM

## 2024-02-15 DIAGNOSIS — Z7984 Long term (current) use of oral hypoglycemic drugs: Secondary | ICD-10-CM

## 2024-02-15 DIAGNOSIS — K429 Umbilical hernia without obstruction or gangrene: Secondary | ICD-10-CM | POA: Diagnosis not present

## 2024-02-15 DIAGNOSIS — K219 Gastro-esophageal reflux disease without esophagitis: Secondary | ICD-10-CM | POA: Diagnosis present

## 2024-02-15 DIAGNOSIS — Z7982 Long term (current) use of aspirin: Secondary | ICD-10-CM

## 2024-02-15 DIAGNOSIS — M069 Rheumatoid arthritis, unspecified: Secondary | ICD-10-CM | POA: Diagnosis present

## 2024-02-15 DIAGNOSIS — N179 Acute kidney failure, unspecified: Secondary | ICD-10-CM | POA: Diagnosis not present

## 2024-02-15 LAB — CBC WITH DIFFERENTIAL/PLATELET
Abs Immature Granulocytes: 0.03 K/uL (ref 0.00–0.07)
Basophils Absolute: 0 K/uL (ref 0.0–0.1)
Basophils Relative: 0 %
Eosinophils Absolute: 0.2 K/uL (ref 0.0–0.5)
Eosinophils Relative: 5 %
HCT: 36.7 % — ABNORMAL LOW (ref 39.0–52.0)
Hemoglobin: 11.6 g/dL — ABNORMAL LOW (ref 13.0–17.0)
Immature Granulocytes: 1 %
Lymphocytes Relative: 19 %
Lymphs Abs: 0.8 K/uL (ref 0.7–4.0)
MCH: 26.3 pg (ref 26.0–34.0)
MCHC: 31.6 g/dL (ref 30.0–36.0)
MCV: 83.2 fL (ref 80.0–100.0)
Monocytes Absolute: 0.4 K/uL (ref 0.1–1.0)
Monocytes Relative: 10 %
Neutro Abs: 2.7 K/uL (ref 1.7–7.7)
Neutrophils Relative %: 65 %
Platelets: 181 K/uL (ref 150–400)
RBC: 4.41 MIL/uL (ref 4.22–5.81)
RDW: 14.6 % (ref 11.5–15.5)
WBC: 4 K/uL (ref 4.0–10.5)
nRBC: 0 % (ref 0.0–0.2)

## 2024-02-15 LAB — COMPREHENSIVE METABOLIC PANEL WITH GFR
ALT: 15 U/L (ref 0–44)
AST: 23 U/L (ref 15–41)
Albumin: 2.9 g/dL — ABNORMAL LOW (ref 3.5–5.0)
Alkaline Phosphatase: 113 U/L (ref 38–126)
Anion gap: 9 (ref 5–15)
BUN: 17 mg/dL (ref 8–23)
CO2: 25 mmol/L (ref 22–32)
Calcium: 9.8 mg/dL (ref 8.9–10.3)
Chloride: 106 mmol/L (ref 98–111)
Creatinine, Ser: 1.64 mg/dL — ABNORMAL HIGH (ref 0.61–1.24)
GFR, Estimated: 46 mL/min — ABNORMAL LOW (ref >60)
Glucose, Bld: 134 mg/dL — ABNORMAL HIGH (ref 70–99)
Potassium: 3.9 mmol/L (ref 3.5–5.1)
Sodium: 140 mmol/L (ref 135–145)
Total Bilirubin: 1.2 mg/dL (ref 0.0–1.2)
Total Protein: 6.7 g/dL (ref 6.5–8.1)

## 2024-02-15 LAB — BRAIN NATRIURETIC PEPTIDE: B Natriuretic Peptide: 51.4 pg/mL (ref 0.0–100.0)

## 2024-02-15 LAB — PROTIME-INR
INR: 1.2 (ref 0.8–1.2)
Prothrombin Time: 15.9 s — ABNORMAL HIGH (ref 11.4–15.2)

## 2024-02-15 LAB — GLUCOSE, CAPILLARY: Glucose-Capillary: 64 mg/dL — ABNORMAL LOW (ref 70–99)

## 2024-02-15 LAB — AMMONIA: Ammonia: 34 umol/L (ref 9–35)

## 2024-02-15 MED ORDER — ENOXAPARIN SODIUM 60 MG/0.6ML IJ SOSY
60.0000 mg | PREFILLED_SYRINGE | INTRAMUSCULAR | Status: DC
Start: 2024-02-15 — End: 2024-02-17
  Administered 2024-02-15 – 2024-02-16 (×2): 60 mg via SUBCUTANEOUS
  Filled 2024-02-15 (×2): qty 0.6

## 2024-02-15 MED ORDER — BISACODYL 5 MG PO TBEC
5.0000 mg | DELAYED_RELEASE_TABLET | Freq: Every day | ORAL | Status: DC | PRN
Start: 2024-02-15 — End: 2024-02-17

## 2024-02-15 MED ORDER — SENNOSIDES-DOCUSATE SODIUM 8.6-50 MG PO TABS: 1.0000 | ORAL_TABLET | Freq: Every evening | ORAL | Status: DC | PRN

## 2024-02-15 MED ORDER — ACETAMINOPHEN 325 MG PO TABS: 650.0000 mg | ORAL_TABLET | Freq: Four times a day (QID) | ORAL | Status: DC | PRN

## 2024-02-15 MED ORDER — INSULIN ASPART 100 UNIT/ML IJ SOLN
0.0000 [IU] | Freq: Three times a day (TID) | INTRAMUSCULAR | Status: DC
  Administered 2024-02-16: 2 [IU] via SUBCUTANEOUS

## 2024-02-15 MED ORDER — ALBUMIN HUMAN 25 % IV SOLN
25.0000 g | Freq: Four times a day (QID) | INTRAVENOUS | Status: DC
  Administered 2024-02-15 – 2024-02-17 (×7): 25 g via INTRAVENOUS
  Filled 2024-02-15 (×7): qty 100

## 2024-02-15 MED ORDER — ACETAMINOPHEN 650 MG RE SUPP: 650.0000 mg | Freq: Four times a day (QID) | RECTAL | Status: DC | PRN

## 2024-02-15 MED ORDER — ONDANSETRON HCL 4 MG/2ML IJ SOLN: 4.0000 mg | Freq: Four times a day (QID) | INTRAMUSCULAR | Status: DC | PRN

## 2024-02-15 MED ORDER — ONDANSETRON HCL 4 MG PO TABS: 4.0000 mg | ORAL_TABLET | Freq: Four times a day (QID) | ORAL | Status: DC | PRN

## 2024-02-15 MED ORDER — INSULIN ASPART 100 UNIT/ML IJ SOLN: 0.0000 [IU] | Freq: Every day | INTRAMUSCULAR | Status: DC

## 2024-02-15 NOTE — ED Provider Notes (Signed)
 [place] EMERGENCY DEPARTMENT AT [facility] Provider Note   CSN: [id number] Arrival date & time: 02/15/24  1046     Patient presents with: Abdominal Pain, fluid retention, and Shortness of Breath   Hunter Clarke is a 68 y.o. male.  Patient is a 68 year old male with PMH of RA, insulin -dependent T2DM, HLD, HTN, GERD, and NASH presenting to the ED with chief complaint of worsening abdominal distention x 2 weeks.  Patient further endorses exertional dyspnea similar onset, which he attributes to the worsening abdominal distention without associated chest pain, cough, congestion, fevers, chills, nausea, vomiting, back pain, syncope, changes in urination, or changes in bowel movements.  He reports that he has not had a therapeutic paracentesis before.    Abdominal Pain Associated symptoms: shortness of breath   Shortness of Breath Associated symptoms: abdominal pain        Prior to Admission medications   Medication Sig Start Date End Date Taking? Authorizing Provider  acetaminophen  (TYLENOL ) 325 MG tablet Take 325-975 mg by mouth every 6 (six) hours as needed for moderate pain.    [provider]  aspirin  81 MG tablet Take 81 mg by mouth daily.    [provider]  gabapentin  (NEURONTIN ) 100 MG capsule Take 2 capsules (200 mg total) by mouth 2 (two) times daily. 09/06/13   Rai, Ripudeep MARLA, MD  Glucosamine-Chondroit-Vit C-Mn (GLUCOSAMINE 1500 COMPLEX PO) Take 3,000 mg by mouth daily.    [provider]  hydrochlorothiazide  (HYDRODIURIL ) 25 MG tablet Take 25 mg by mouth daily. 08/07/13   [provider]  lisinopril  (PRINIVIL ,ZESTRIL ) 20 MG tablet Take 20 mg by mouth daily. 08/07/13   [provider]  metFORMIN (GLUCOPHAGE) 1000 MG tablet Take 1,000 mg by mouth 2 (two) times daily. 08/07/13   [provider]  Multiple Vitamins-Minerals (MULTIVITAMIN PO) Take 1 tablet by mouth daily.    [provider]   neomycin-bacitracin-polymyxin (NEOSPORIN) ointment Apply 1 application topically daily as needed for wound care.    [provider]  omeprazole (PRILOSEC) 20 MG capsule Take 20 mg by mouth 2 (two) times daily. 08/15/13   [provider]  simvastatin  (ZOCOR ) 40 MG tablet Take 40 mg by mouth daily. 08/07/13   [provider]  traMADol  (ULTRAM ) 50 MG tablet Take 50 mg by mouth 2 (two) times daily as needed for moderate pain.  08/07/13   [provider]    Allergies: Tikosyn [dofetilide]    Review of Systems  Respiratory:  Positive for shortness of breath.   Gastrointestinal:  Positive for abdominal pain.    Updated Vital Signs BP (!) 125/54   Pulse 74   Temp (!) 97.4 F (36.3 C) (Oral)   Resp 16   Ht 5' 7 (1.702 m)   Wt 126.2 kg   SpO2 97%   BMI 43.58 kg/m   Physical Exam Constitutional:      Appearance: He is well-developed.  HENT:     Head: Normocephalic and atraumatic.     Nose: Nose normal.     Mouth/Throat:     Mouth: Mucous membranes are moist.     Pharynx: Oropharynx is clear.  Eyes:     Extraocular Movements: Extraocular movements intact.     Pupils: Pupils are equal, round, and reactive to light.  Cardiovascular:     Rate and Rhythm: Normal rate and regular rhythm.     Pulses: Normal pulses.     Heart sounds: Normal heart sounds.  Abdominal:  Tenderness: There is no abdominal tenderness.     Comments: Protuberant abdomen without tenderness to palpation  Umbilical hernia present -soft, with no overlying skin changes  Musculoskeletal:        General: Normal range of motion.  Skin:    General: Skin is warm and dry.     Capillary Refill: Capillary refill takes less than 2 seconds.  Neurological:     General: No focal deficit present.     Mental Status: He is alert and oriented to person, place, and time.     (all labs ordered are listed, but only abnormal results are displayed) Labs Reviewed  CBC WITH  DIFFERENTIAL/PLATELET - Abnormal; Notable for the following components:      Result Value   Hemoglobin 11.6 (*)    HCT 36.7 (*)    All other components within normal limits  COMPREHENSIVE METABOLIC PANEL WITH GFR - Abnormal; Notable for the following components:   Glucose, Bld 134 (*)    Creatinine, Ser 1.64 (*)    Albumin 2.9 (*)    GFR, Estimated 46 (*)    All other components within normal limits  BRAIN NATRIURETIC PEPTIDE  AMMONIA  URINALYSIS, ROUTINE W REFLEX MICROSCOPIC  PROTIME-INR    EKG: EKG Interpretation Date/Time:  Friday February 15 2024 12:09:10 EDT Ventricular Rate:  86 PR Interval:  156 QRS Duration:  98 QT Interval:  384 QTC Calculation: 459 R Axis:   -24  Text Interpretation: Normal sinus rhythm Septal infarct , age undetermined Abnormal ECG When compared with ECG of 04-Sep-2013 13:02, PREVIOUS ECG IS PRESENT when compared to prior, t wave inversion in leads 3 and AVF No STEMI Confirmed by Ginger Barefoot ([id number]) on 02/15/2024 6:29:26 PM  Radiology: CT Renal Stone Study Result Date: 02/15/2024 EXAM: CT UROGRAM 02/15/2024 12:39:35 PM TECHNIQUE: CT of the abdomen and pelvis was performed without intravenous contrast. Multiplanar reformatted images as well as MIP urogram images are provided for review. Automated exposure control, iterative reconstruction, and/or weight based adjustment of the mA/kV was utilized to reduce the radiation dose to as low as reasonably achievable. COMPARISON: 12/26/2023 CLINICAL HISTORY: Abdominal Pain; fluid retention; Shortness of Breath. FINDINGS: LOWER CHEST: No acute abnormality. LIVER: Hepatic cirrhosis is noted. GALLBLADDER AND BILE DUCTS: Gallbladder is unremarkable. No biliary ductal dilatation. SPLEEN: Mild splenomegaly is noted. PANCREAS: No acute abnormality. ADRENAL GLANDS: No acute abnormality. KIDNEYS, URETERS AND BLADDER: No stones in the kidneys or ureters. No hydronephrosis. No perinephric or periureteral stranding. Urinary  bladder is unremarkable. GI AND BOWEL: Stomach demonstrates no acute abnormality. There is no bowel obstruction. PERITONEUM AND RETROPERITONEUM: Moderate ascites is noted. No free air. VASCULATURE: Aorta is normal in caliber. LYMPH NODES: No lymphadenopathy. REPRODUCTIVE ORGANS: No acute abnormality. BONES AND SOFT TISSUES: Moderate-sized periumbilical hernia is noted which contains fluid. No acute osseous abnormality. IMPRESSION: 1. Hepatic cirrhosis with moderate ascites and mild splenomegaly. 2. Moderate periumbilical hernia containing fluid. Electronically signed by: Lynwood Seip MD 02/15/2024 01:53 PM EDT RP Workstation: [id number]     Procedures   Medications Ordered in the ED - No data to display  Clinical Course as of 02/15/24 1910  Fri Feb 15, 2024  1858 DW hospitalist. [WB]    Clinical Course User Index [WB] Dorcus Fallow, MD                                 Medical Decision Making Amount and/or Complexity of Data Reviewed Labs:  ordered.  Risk Decision regarding hospitalization.   Six 12-year-old male with PMH as above presenting to the ED with worsening abdominal distention x 2 weeks in the setting of known NASH cirrhosis thought accompanying infectious symptoms.  Patient also endorsing exertional dyspnea to which he attributes to abdominal distention.  Denying chest pain or anginal equivalents at this time.  Upon arrival, patient hemodynamically stable in no acute distress.  Normotensive.  Afebrile.  No tachycardia or tachypnea.  Saturate 97% on RA.  GCS 15.  Patient overall nontoxic-appearing with no conversational dyspnea present.  Large umbilical hernia present on examination, soft with no overlying skin changes.  Abdomen diffusely protuberant with no tenderness to palpation.  No rebound or guarding.  No accompanying infectious symptoms including fevers, chills, nausea, vomiting.  Low suspicion for SBP at this time.  CBC reviewed with no evidence of leukocytosis or anemia.   CMP significant for AKI with creatinine 1.64/eGFR 46, with baseline creatinine 0.7-0.9, concerning for potential hepatorenal syndrome.  Notably, patient denying any recent bleeding or mental status changes.  Discussed with hospitalist service, patient to be admitted for AKI with concern for potential hepatorenal syndrome.  Patient will likely need inpatient GI consult in the setting of worsening ascites without history of prior paracentesis.  Plan of care discussed with hospitalist team.  Patient updated regarding plan of care.  Final diagnoses:  AKI (acute kidney injury)  Other ascites    ED Discharge Orders     None          Shontez Sermon, Elsie, MD 02/15/24 1910    Tegeler, Lonni PARAS, MD 02/18/24 [phone]

## 2024-02-15 NOTE — H&P (Signed)
 History and Physical  Hunter Clarke FMW:[id number] DOB: [birth year] DOA: 02/15/2024  PCP: Sim Emery CROME, MD   Chief Complaint: Abdominal swelling, shortness of breath  HPI: Hunter Clarke is a 68 y.o. male with medical history significant for T2DM, HTN, HLD, GERD, umbilical hernia, rheumatoid arthritis, and NASH cirrhosis who presented to the ED for evaluation of abdominal swelling.  Patient reports he was [facility] months [facility] for his umbilical hernia that has been increasing in size.  During his last visit 3 weeks ago, he was noted to have significant abdominal ascites on CT patient was referred to [facility] transplant center, over the last 2 weeks, he is abdominal swelling has significantly progressed and he is now having mild shortness of breath with exertion.  Reports abdominal and chest tightness but denies any abdominal pain, nausea, vomiting, bloody stools, fevers, chills, cough, dizziness, constipation or diarrhea.  He denies any EtOH use.  He was previously followed by GI but reports he was kicked out of the office and has not had any follow-up in more than 5 years.  ED Course: Initial vitals show patient afebrile, SBP 120-160s, SpO2 97% on room air. Initial labs significant for sodium 140, K+ 3.9, glucose 134, BUNs/creatinine 17/1.64, normal LFTs, albumin 2.9, ammonia 34, WBC 4.0, Hgb 11.6, platelet 181, BNP 51, PT/INR 15.9/1.2. TRH was consulted for admission.   Review of Systems: Please see HPI for pertinent positives and negatives. A complete 10 system review of systems are otherwise negative.  Past Medical History:  Diagnosis Date   Diabetes mellitus without complication (HCC)    Dyslipidemia    GERD (gastroesophageal reflux disease)    Hypertension    RA (rheumatoid arthritis) (HCC)    Past Surgical History:  Procedure Laterality Date   TONSILLECTOMY     URETHRAL STRICTURE DILATATION     Social History:  reports that he has quit  smoking. His smoking use included cigarettes. He has a 40 pack-year smoking history. He does not have any smokeless tobacco history on file. He reports that he does not drink alcohol and does not use drugs.  Allergies  Allergen Reactions   Lisinopril  Other (See Comments)   Tetracycline Other (See Comments)   Tikosyn [Dofetilide] Other (See Comments)    Unknown reaction     Family History  Problem Relation Age of Onset   Heart attack Mother        MI at age 59   Stroke Mother    Heart attack Other      Prior to Admission medications   Medication Sig Start Date End Date Taking? Authorizing Provider  acetaminophen  (TYLENOL ) 325 MG tablet Take 325-975 mg by mouth every 6 (six) hours as needed for moderate pain.    [provider]  aspirin  81 MG tablet Take 81 mg by mouth daily.    [provider]  gabapentin  (NEURONTIN ) 100 MG capsule Take 2 capsules (200 mg total) by mouth 2 (two) times daily. 09/06/13   Rai, Ripudeep MARLA, MD  Glucosamine-Chondroit-Vit C-Mn (GLUCOSAMINE 1500 COMPLEX PO) Take 3,000 mg by mouth daily.    [provider]  hydrochlorothiazide  (HYDRODIURIL ) 25 MG tablet Take 25 mg by mouth daily. 08/07/13   [provider]  lisinopril  (PRINIVIL ,ZESTRIL ) 20 MG tablet Take 20 mg by mouth daily. 08/07/13   [provider]  metFORMIN (GLUCOPHAGE) 1000 MG tablet Take 1,000 mg by mouth 2 (two) times daily. 08/07/13   [provider]  Multiple Vitamins-Minerals (  MULTIVITAMIN PO) Take 1 tablet by mouth daily.    [provider]  neomycin-bacitracin-polymyxin (NEOSPORIN) ointment Apply 1 application topically daily as needed for wound care.    [provider]  omeprazole (PRILOSEC) 20 MG capsule Take 20 mg by mouth 2 (two) times daily. 08/15/13   [provider]  simvastatin  (ZOCOR ) 40 MG tablet Take 40 mg by mouth daily. 08/07/13   [provider]  traMADol  (ULTRAM ) 50 MG tablet Take 50 mg by mouth 2 (two)  times daily as needed for moderate pain.  08/07/13   [provider]    Physical Exam: BP (!) 144/72 (BP Location: Right Arm)   Pulse 75   Temp (!) 97.5 F (36.4 C) (Oral)   Resp 16   Ht 5' 7 (1.702 m)   Wt 126.2 kg   SpO2 100%   BMI 43.58 kg/m  General: Pleasant, well-appearing Caucasian man laying in bed. No acute distress. HEENT: [place]/AT. Anicteric sclera CV: RRR. No murmurs, rubs, or gallops. 1+ BLE edema up to thighs. Pulmonary: Lungs CTAB. Normal effort. No wheezing or rales. Abdominal: Soft, severe abdominal distention with reducible moderate-sized umbilical hernia.  Nontender.  Normal bowel sounds. Extremities: Palpable radial and DP pulses. Normal ROM. Skin: Warm and dry. No obvious rash or lesions. Neuro: A&Ox3. Moves all extremities. Normal sensation to light touch. No focal deficit. Psych: Normal mood and affect          Labs on Admission:  Basic Metabolic Panel: Recent Labs  Lab 02/15/24 1153  NA 140  K 3.9  CL 106  CO2 25  GLUCOSE 134*  BUN 17  CREATININE 1.64*  CALCIUM 9.8   Liver Function Tests: Recent Labs  Lab 02/15/24 1153  AST 23  ALT 15  ALKPHOS 113  BILITOT 1.2  PROT 6.7  ALBUMIN 2.9*   No results for input(s): LIPASE, AMYLASE in the last 168 hours. Recent Labs  Lab 02/15/24 1210  AMMONIA 34   CBC: Recent Labs  Lab 02/15/24 1153  WBC 4.0  NEUTROABS 2.7  HGB 11.6*  HCT 36.7*  MCV 83.2  PLT 181   Cardiac Enzymes: No results for input(s): CKTOTAL, CKMB, CKMBINDEX, TROPONINI in the last 168 hours. BNP (last 3 results) Recent Labs    02/15/24 1210  BNP 51.4    ProBNP (last 3 results) No results for input(s): PROBNP in the last 8760 hours.  CBG: No results for input(s): GLUCAP in the last 168 hours.  Radiological Exams on Admission: CT Renal Stone Study Result Date: 02/15/2024 EXAM: CT UROGRAM 02/15/2024 12:39:35 PM TECHNIQUE: CT of the abdomen and pelvis was performed without intravenous  contrast. Multiplanar reformatted images as well as MIP urogram images are provided for review. Automated exposure control, iterative reconstruction, and/or weight based adjustment of the mA/kV was utilized to reduce the radiation dose to as low as reasonably achievable. COMPARISON: 12/26/2023 CLINICAL HISTORY: Abdominal Pain; fluid retention; Shortness of Breath. FINDINGS: LOWER CHEST: No acute abnormality. LIVER: Hepatic cirrhosis is noted. GALLBLADDER AND BILE DUCTS: Gallbladder is unremarkable. No biliary ductal dilatation. SPLEEN: Mild splenomegaly is noted. PANCREAS: No acute abnormality. ADRENAL GLANDS: No acute abnormality. KIDNEYS, URETERS AND BLADDER: No stones in the kidneys or ureters. No hydronephrosis. No perinephric or periureteral stranding. Urinary bladder is unremarkable. GI AND BOWEL: Stomach demonstrates no acute abnormality. There is no bowel obstruction. PERITONEUM AND RETROPERITONEUM: Moderate ascites is noted. No free air. VASCULATURE: Aorta is normal in caliber. LYMPH NODES: No lymphadenopathy. REPRODUCTIVE ORGANS: No acute abnormality.  BONES AND SOFT TISSUES: Moderate-sized periumbilical hernia is noted which contains fluid. No acute osseous abnormality. IMPRESSION: 1. Hepatic cirrhosis with moderate ascites and mild splenomegaly. 2. Moderate periumbilical hernia containing fluid. Electronically signed by: Lynwood Seip MD 02/15/2024 01:53 PM EDT RP Workstation: [id number]   Assessment/Plan Hunter Clarke is a 68 y.o. male with medical history significant for T2DM, HTN, HLD, GERD, obesity, umbilical hernia, rheumatoid arthritis, and NASH cirrhosis who presented to the ED for evaluation of abdominal swelling and admitted for abdominal ascites.   # Abdominal ascites # NASH cirrhosis - Patient with NASH cirrhosis with recent development of abdominal ascites presented with worsening abdominal distention - LFTs, INR and platelet within normal limits, patient denies EtOH use or history of  hepatitis - Abdominal imaging shows hepatic cirrhosis with moderate ascites and mild splenomegaly - MELD sodium score of 14, < 2% estimated 90-day mortality, Child-Pugh class B - IR consulted for therapeutic paracentesis - Follow-up with [facility] liver transplant team at discharge  # AKI - Creatinine elevated to 1.64, from normal baseline last creatinine 0.71 10 years ago - Likely secondary to intravascular depletion due to fluid shift in the setting of cirrhosis and decreased oncotic pressure - Start IV albumin 25 mg every 6 hours for 2 days for volume expansion and to rule out HRS-AKI - Hold HCTZ and trend renal function - Avoid nephrotoxic meds  # T2DM - No recent A1c on file - Blood glucose of 134 on admission - Home regimen includes Lantus 30 units twice daily, Ozempic, metformin and glipizide - Semglee 30 units twice daily, continue glipizide and hold metformin - SSI with meals, CBG monitoring - Carb modified diet - F/u repeat A1c  # Umbilical hernia - Followed by [facility] - Abdominal imaging shows moderate periumbilical hernia containing fluid - On review of patient's last visit on 8/25, patient deem to be significant surgical risk due to cirrhosis - Patient referred to [facility] liver center for evaluation and management  # HTN - BP slightly elevated with SBP in the 120s to 160s  # HLD - Continue simvastatin   # GERD - Continue Protonix   # Class III obesity Body mass index is 43.58 kg/m. Filed Weights   02/15/24 1157  Weight: 126.2 kg  - Weight is partially due to fluid retention - F/u with PCP for weight lost and nutrition counseling - Continue Ozempic at discharge  DVT prophylaxis: Lovenox      Code Status: Full Code  Consults called: IR  Family Communication: No family at bedside  Severity of Illness: The appropriate patient status for this patient is OBSERVATION. Observation status is judged to be reasonable and necessary in order to  provide the required intensity of service to ensure the patient's safety. The patient's presenting symptoms, physical exam findings, and initial radiographic and laboratory data in the context of their medical condition is felt to place them at decreased risk for further clinical deterioration. Furthermore, it is anticipated that the patient will be medically stable for discharge from the hospital within 2 midnights of admission.   Level of care: Med-Surg    Lou Claretta HERO, MD 02/15/2024, 8:14 PM [facility] Pager: [phone] Isaiah 41:10   If 7PM-7AM, please contact night-coverage [email or web address] Password [email or web address]

## 2024-02-15 NOTE — ED Triage Notes (Signed)
 Pt. Stated, Hunter Clarke been retaining fluid all over especially in my abdomen. I have bad Liver disease. I dont drink

## 2024-02-15 NOTE — ED Provider Triage Note (Signed)
 Emergency Medicine Provider Triage Evaluation Note  Hunter Clarke , a 68 y.o. male  was evaluated in triage.  Pt complains of shortness of breath, severe swelling of abdomen.  Recently diagnosed with severe liver disease.  He has not had therapeutic paracentesis before. Reports significant weight game. NASH, no hx of ETOh use.  Review of Systems  Positive: Shob, belly pain / swelling Negative:   Physical Exam  BP (!) 169/83   Pulse 91   Temp 97.8 F (36.6 C)   Resp 18   Ht 5' 7 (1.702 m)   Wt 126.2 kg   SpO2 97%   BMI 43.58 kg/m  Gen:   Awake, no distress   Resp:  Normal effort  MSK:   Moves extremities without difficulty  Other:  Severe Abdominal distension, fluid wave, mild jaundice noted  Medical Decision Making  Medically screening exam initiated at 12:11 PM.  Appropriate orders placed.  Hunter Clarke was informed that the remainder of the evaluation will be completed by another provider, this initial triage assessment does not replace that evaluation, and the importance of remaining in the ED until their evaluation is complete.  Workup initiated in triage    Rosan Sherlean DEL, PA-C 02/15/24 1213

## 2024-02-15 NOTE — Plan of Care (Signed)

## 2024-02-16 ENCOUNTER — Observation Stay (HOSPITAL_COMMUNITY)

## 2024-02-16 DIAGNOSIS — M069 Rheumatoid arthritis, unspecified: Secondary | ICD-10-CM | POA: Diagnosis present

## 2024-02-16 DIAGNOSIS — Z8614 Personal history of Methicillin resistant Staphylococcus aureus infection: Secondary | ICD-10-CM | POA: Diagnosis not present

## 2024-02-16 DIAGNOSIS — I10 Essential (primary) hypertension: Secondary | ICD-10-CM | POA: Diagnosis present

## 2024-02-16 DIAGNOSIS — Z7984 Long term (current) use of oral hypoglycemic drugs: Secondary | ICD-10-CM | POA: Diagnosis not present

## 2024-02-16 DIAGNOSIS — N179 Acute kidney failure, unspecified: Secondary | ICD-10-CM | POA: Diagnosis present

## 2024-02-16 DIAGNOSIS — Z6841 Body Mass Index (BMI) 40.0 and over, adult: Secondary | ICD-10-CM | POA: Diagnosis not present

## 2024-02-16 DIAGNOSIS — K7469 Other cirrhosis of liver: Secondary | ICD-10-CM | POA: Diagnosis present

## 2024-02-16 DIAGNOSIS — K767 Hepatorenal syndrome: Secondary | ICD-10-CM | POA: Diagnosis present

## 2024-02-16 DIAGNOSIS — E1165 Type 2 diabetes mellitus with hyperglycemia: Secondary | ICD-10-CM

## 2024-02-16 DIAGNOSIS — E782 Mixed hyperlipidemia: Secondary | ICD-10-CM | POA: Diagnosis present

## 2024-02-16 DIAGNOSIS — Z8249 Family history of ischemic heart disease and other diseases of the circulatory system: Secondary | ICD-10-CM | POA: Diagnosis not present

## 2024-02-16 DIAGNOSIS — Z888 Allergy status to other drugs, medicaments and biological substances status: Secondary | ICD-10-CM | POA: Diagnosis not present

## 2024-02-16 DIAGNOSIS — K7581 Nonalcoholic steatohepatitis (NASH): Secondary | ICD-10-CM | POA: Diagnosis present

## 2024-02-16 DIAGNOSIS — R188 Other ascites: Secondary | ICD-10-CM | POA: Diagnosis present

## 2024-02-16 DIAGNOSIS — Z794 Long term (current) use of insulin: Secondary | ICD-10-CM | POA: Diagnosis not present

## 2024-02-16 DIAGNOSIS — K219 Gastro-esophageal reflux disease without esophagitis: Secondary | ICD-10-CM | POA: Diagnosis present

## 2024-02-16 DIAGNOSIS — Z87891 Personal history of nicotine dependence: Secondary | ICD-10-CM | POA: Diagnosis not present

## 2024-02-16 DIAGNOSIS — Z7982 Long term (current) use of aspirin: Secondary | ICD-10-CM | POA: Diagnosis not present

## 2024-02-16 DIAGNOSIS — Z79899 Other long term (current) drug therapy: Secondary | ICD-10-CM | POA: Diagnosis not present

## 2024-02-16 DIAGNOSIS — K429 Umbilical hernia without obstruction or gangrene: Secondary | ICD-10-CM

## 2024-02-16 DIAGNOSIS — E66813 Obesity, class 3: Secondary | ICD-10-CM | POA: Diagnosis present

## 2024-02-16 LAB — BASIC METABOLIC PANEL WITH GFR
Anion gap: 8 (ref 5–15)
BUN: 20 mg/dL (ref 8–23)
CO2: 28 mmol/L (ref 22–32)
Calcium: 9.4 mg/dL (ref 8.9–10.3)
Chloride: 104 mmol/L (ref 98–111)
Creatinine, Ser: 1.56 mg/dL — ABNORMAL HIGH (ref 0.61–1.24)
GFR, Estimated: 49 mL/min — ABNORMAL LOW (ref >60)
Glucose, Bld: 207 mg/dL — ABNORMAL HIGH (ref 70–99)
Potassium: 4.1 mmol/L (ref 3.5–5.1)
Sodium: 140 mmol/L (ref 135–145)

## 2024-02-16 LAB — CBC
HCT: 32.5 % — ABNORMAL LOW (ref 39.0–52.0)
Hemoglobin: 10.2 g/dL — ABNORMAL LOW (ref 13.0–17.0)
MCH: 26.4 pg (ref 26.0–34.0)
MCHC: 31.4 g/dL (ref 30.0–36.0)
MCV: 84 fL (ref 80.0–100.0)
Platelets: 153 K/uL (ref 150–400)
RBC: 3.87 MIL/uL — ABNORMAL LOW (ref 4.22–5.81)
RDW: 14.6 % (ref 11.5–15.5)
WBC: 2.7 K/uL — ABNORMAL LOW (ref 4.0–10.5)
nRBC: 0 % (ref 0.0–0.2)

## 2024-02-16 LAB — GLUCOSE, CAPILLARY
Glucose-Capillary: 121 mg/dL — ABNORMAL HIGH (ref 70–99)
Glucose-Capillary: 189 mg/dL — ABNORMAL HIGH (ref 70–99)
Glucose-Capillary: 76 mg/dL (ref 70–99)
Glucose-Capillary: 83 mg/dL (ref 70–99)
Glucose-Capillary: 86 mg/dL (ref 70–99)

## 2024-02-16 LAB — HIV ANTIBODY (ROUTINE TESTING W REFLEX): HIV Screen 4th Generation wRfx: NONREACTIVE

## 2024-02-16 LAB — HEMOGLOBIN A1C
Hgb A1c MFr Bld: 6.9 % — ABNORMAL HIGH (ref 4.8–5.6)
Mean Plasma Glucose: 151.33 mg/dL

## 2024-02-16 MED ORDER — SIMVASTATIN 20 MG PO TABS
40.0000 mg | ORAL_TABLET | Freq: Every day | ORAL | Status: DC
Start: 2024-02-16 — End: 2024-02-17
  Administered 2024-02-16: 40 mg via ORAL
  Filled 2024-02-16: qty 2

## 2024-02-16 MED ORDER — SPIRONOLACTONE 25 MG PO TABS
100.0000 mg | ORAL_TABLET | Freq: Every day | ORAL | Status: DC
  Administered 2024-02-16 – 2024-02-17 (×2): 100 mg via ORAL
  Filled 2024-02-16 (×2): qty 4

## 2024-02-16 MED ORDER — FUROSEMIDE 40 MG PO TABS
40.0000 mg | ORAL_TABLET | Freq: Every day | ORAL | Status: DC
  Administered 2024-02-16 – 2024-02-17 (×2): 40 mg via ORAL
  Filled 2024-02-16 (×2): qty 1

## 2024-02-16 MED ORDER — LIDOCAINE HCL (PF) 1 % IJ SOLN
10.0000 mL | Freq: Once | INTRAMUSCULAR | Status: AC
  Administered 2024-02-16: 10 mL

## 2024-02-16 MED ORDER — GLIPIZIDE 10 MG PO TABS
10.0000 mg | ORAL_TABLET | Freq: Two times a day (BID) | ORAL | Status: DC
  Administered 2024-02-16 – 2024-02-17 (×3): 10 mg via ORAL
  Filled 2024-02-16 (×4): qty 1

## 2024-02-16 MED ORDER — PANTOPRAZOLE SODIUM 40 MG PO TBEC
40.0000 mg | DELAYED_RELEASE_TABLET | Freq: Every day | ORAL | Status: DC
  Administered 2024-02-16 – 2024-02-17 (×2): 40 mg via ORAL
  Filled 2024-02-16 (×2): qty 1

## 2024-02-16 MED ORDER — INSULIN GLARGINE-YFGN 100 UNIT/ML ~~LOC~~ SOLN
30.0000 [IU] | Freq: Two times a day (BID) | SUBCUTANEOUS | Status: DC
  Administered 2024-02-17: 30 [IU] via SUBCUTANEOUS
  Filled 2024-02-16 (×4): qty 0.3

## 2024-02-16 NOTE — Hospital Course (Signed)
 68 y.o. male with medical history significant for T2DM, HTN, HLD, GERD, umbilical hernia, rheumatoid arthritis, and NASH cirrhosis who presented to the ED for evaluation of abdominal swelling.    Assessment and Plan:   NASH cirrhosis with abdominal ascites - no previous history of paracentesis.  Recent diagnosis of cirrhosis with outpatient follow-up initiated.  Has not established with hepatology at this point.  S/p paracentesis this morning 9 L.  Already receiving albumin.  Initiating Lasix p.o. 40 mg daily along with spironolactone 100 mg daily.  Recommend patient follow-up with hepatology/liver transplant team at discharge.   Acute kidney injury - Concern for hepatorenal syndrome.  Creatinine 1.64 on presentation.  Showing mild improvement to 1.56 this morning.  Initiating diuretics as above.  Albumin on board.  Will recheck BMP in AM.   Diabetes mellitus - A1c 6.9 suggesting moderate control.  Currently on insulin  sliding scale, glargine 30 units twice daily.  Resume home medication regimen upon discharge.   Chronic umbilical hernia - Followed by [facility].  Deemed significant surgical risk due to cirrhosis and noted ascites.   HTN - Initiating diuretics as above.  Monitor blood pressure closely.  May benefit from BB.   HLD - Simvastatin  on board.   Class III obesity - BMI greater than 40.  Encourage lifestyle modifications.  Patient initiated on Ozempic prior to admission.   Goals of care - Monitoring patient's renal function and blood pressure s/p paracentesis and initiation of diuretics.  Anticipate discharge home tomorrow with recommendations to follow-up with hepatology ASAP in outpatient setting.

## 2024-02-16 NOTE — Procedures (Signed)
 PROCEDURE SUMMARY:  Successful ultrasound guided paracentesis from the left lower quadrant.  Yielded 9 L of clear yellow fluid.  No immediate complications.  The patient tolerated the procedure well.   Specimen not sent for labs.  EBL < 5mL  If the patient eventually requires >/=2 paracenteses in a 30 day period, screening evaluation by the [facility] Interventional Radiology Portal Hypertension Clinic will be assessed.  Warren Dais, AGACNP-BC 02/16/2024, 12:47 PM .

## 2024-02-16 NOTE — Care Management Obs Status (Signed)
 MEDICARE OBSERVATION STATUS NOTIFICATION   Patient Details  Name: Hunter Clarke MRN: [id number] Date of Birth: 1956-02-02   Medicare Observation Status Notification Given:  Yes    Antania Hoefling G., RN 02/16/2024, 9:36 AM

## 2024-02-16 NOTE — Progress Notes (Signed)
 Progress Note   Patient: Hunter Clarke FMW:[id number] DOB: 1956/01/19 DOA: 02/15/2024  DOS: the patient was seen and examined on 02/16/2024   Brief hospital course:  68 y.o. male with medical history significant for T2DM, HTN, HLD, GERD, umbilical hernia, rheumatoid arthritis, and NASH cirrhosis who presented to the ED for evaluation of abdominal swelling.   Assessment and Plan:  NASH cirrhosis with abdominal ascites - no previous history of paracentesis.  Recent diagnosis of cirrhosis with outpatient follow-up initiated.  Has not established with hepatology at this point.  S/p paracentesis this morning 9 L.  Already receiving albumin.  Initiating Lasix p.o. 40 mg daily along with spironolactone 100 mg daily.  Recommend patient follow-up with hepatology/liver transplant team at discharge.  Acute kidney injury - Concern for hepatorenal syndrome.  Creatinine 1.64 on presentation.  Showing mild improvement to 1.56 this morning.  Initiating diuretics as above.  Albumin on board.  Will recheck BMP in AM.  Diabetes mellitus - A1c 6.9 suggesting moderate control.  Currently on insulin  sliding scale, glargine 30 units twice daily.  Resume home medication regimen upon discharge.  Chronic umbilical hernia - Followed by [facility].  Deemed significant surgical risk due to cirrhosis and noted ascites.  HTN - Initiating diuretics as above.  Monitor blood pressure closely.  May benefit from BB.  HLD - Simvastatin  on board.  Class III obesity - BMI greater than 40.  Encourage lifestyle modifications.  Patient initiated on Ozempic prior to admission.  Goals of care - Monitoring patient's renal function and blood pressure s/p paracentesis and initiation of diuretics.  Anticipate discharge home tomorrow with recommendations to follow-up with hepatology ASAP in outpatient setting.   Subjective: Patient evaluated this morning prior to paracentesis.  Denies any fever, shortness of  breath, chest pain, nausea, vomiting.  Only complaining of abdominal distention.  Denies any history of paracentesis in the past but does admit to receiving referrals to hepatology.  Has not yet been established.  Eager for discharge home shortly after procedure if he feels well enough.  Physical Exam:  Vitals:   02/15/24 2021 02/15/24 2358 02/16/24 0419 02/16/24 0748  BP: (!) 140/82 112/65 114/64 139/74  Pulse: 84 92 89 88  Resp:  17 16 18   Temp: (!) 97.4 F (36.3 C) 97.7 F (36.5 C) 98.6 F (37 C) 98 F (36.7 C)  TempSrc:   Oral   SpO2: 94% 98% 97% 94%  Weight:      Height:        GENERAL:  Alert, pleasant, no acute distress  HEENT:  EOMI CARDIOVASCULAR:  RRR, no murmurs appreciated RESPIRATORY:  Clear to auscultation, no wheezing, rales, or rhonchi GASTROINTESTINAL:  Soft, moderately distended, fluid wave EXTREMITIES: Mild LE edema bilaterally NEURO:  No new focal deficits appreciated SKIN:  No rashes noted PSYCH:  Appropriate mood and affect     Data Reviewed:  Imaging Studies: US  Paracentesis Result Date: 02/16/2024 INDICATION: Patient with a history of cirrhosis presents today with ascites. Interventional radiology asked to perform a therapeutic paracentesis. EXAM: ULTRASOUND GUIDED PARACENTESIS MEDICATIONS: 1% lidocaine 10 mL COMPLICATIONS: None immediate. PROCEDURE: Informed written consent was obtained from the patient after a discussion of the risks, benefits and alternatives to treatment. A timeout was performed prior to the initiation of the procedure. Initial ultrasound scanning demonstrates a large amount of ascites within the left lower abdominal quadrant. The left lower abdomen was prepped and draped in the usual sterile fashion. 1% lidocaine was used for local  anesthesia. Following this, a 19 gauge, 7-cm, Yueh catheter was introduced. An ultrasound image was saved for documentation purposes. The paracentesis was performed. The catheter was removed and a dressing  was applied. The patient tolerated the procedure well without immediate post procedural complication. FINDINGS: A total of approximately 9 L of clear yellow fluid was removed. IMPRESSION: Successful ultrasound-guided paracentesis yielding 9 liters of peritoneal fluid. Procedure performed: Warren Dais, NP PLAN: If the patient eventually requires >/=2 paracenteses in a 30 day period, candidacy for formal evaluation by the [facility] Interventional Radiology Portal Hypertension Clinic will be assessed. Electronically Signed   By: Wilkie Lent M.D.   On: 02/16/2024 13:37   CT Renal Stone Study Result Date: 02/15/2024 EXAM: CT UROGRAM 02/15/2024 12:39:35 PM TECHNIQUE: CT of the abdomen and pelvis was performed without intravenous contrast. Multiplanar reformatted images as well as MIP urogram images are provided for review. Automated exposure control, iterative reconstruction, and/or weight based adjustment of the mA/kV was utilized to reduce the radiation dose to as low as reasonably achievable. COMPARISON: 12/26/2023 CLINICAL HISTORY: Abdominal Pain; fluid retention; Shortness of Breath. FINDINGS: LOWER CHEST: No acute abnormality. LIVER: Hepatic cirrhosis is noted. GALLBLADDER AND BILE DUCTS: Gallbladder is unremarkable. No biliary ductal dilatation. SPLEEN: Mild splenomegaly is noted. PANCREAS: No acute abnormality. ADRENAL GLANDS: No acute abnormality. KIDNEYS, URETERS AND BLADDER: No stones in the kidneys or ureters. No hydronephrosis. No perinephric or periureteral stranding. Urinary bladder is unremarkable. GI AND BOWEL: Stomach demonstrates no acute abnormality. There is no bowel obstruction. PERITONEUM AND RETROPERITONEUM: Moderate ascites is noted. No free air. VASCULATURE: Aorta is normal in caliber. LYMPH NODES: No lymphadenopathy. REPRODUCTIVE ORGANS: No acute abnormality. BONES AND SOFT TISSUES: Moderate-sized periumbilical hernia is noted which contains fluid. No acute osseous abnormality.  IMPRESSION: 1. Hepatic cirrhosis with moderate ascites and mild splenomegaly. 2. Moderate periumbilical hernia containing fluid. Electronically signed by: Lynwood Seip MD 02/15/2024 01:53 PM EDT RP Workstation: [id number]    There are no new results to review at this time.  Previous records (including but not limited to H&P, progress notes, nursing notes, TOC management) were reviewed in assessment of this patient.  Labs: CBC: Recent Labs  Lab 02/15/24 1153 02/16/24 0151  WBC 4.0 2.7*  NEUTROABS 2.7  --   HGB 11.6* 10.2*  HCT 36.7* 32.5*  MCV 83.2 84.0  PLT 181 153   Basic Metabolic Panel: Recent Labs  Lab 02/15/24 1153 02/16/24 0151  NA 140 140  K 3.9 4.1  CL 106 104  CO2 25 28  GLUCOSE 134* 207*  BUN 17 20  CREATININE 1.64* 1.56*  CALCIUM 9.8 9.4   Liver Function Tests: Recent Labs  Lab 02/15/24 1153  AST 23  ALT 15  ALKPHOS 113  BILITOT 1.2  PROT 6.7  ALBUMIN 2.9*   CBG: Recent Labs  Lab 02/15/24 2114 02/16/24 0009 02/16/24 0749 02/16/24 1229  GLUCAP 64* 189* 76 83    Scheduled Meds:  enoxaparin  (LOVENOX ) injection  60 mg Subcutaneous Q24H   glipiZIDE  10 mg Oral BID WC   insulin  aspart  0-15 Units Subcutaneous TID WC   insulin  aspart  0-5 Units Subcutaneous QHS   insulin  glargine-yfgn  30 Units Subcutaneous BID   pantoprazole   40 mg Oral Daily   simvastatin   40 mg Oral QHS   Continuous Infusions:  albumin human 25 g (02/16/24 0845)   PRN Meds:.acetaminophen  **OR** acetaminophen , bisacodyl, ondansetron  **OR** ondansetron  (ZOFRAN ) IV, senna-docusate  Family Communication: None at bedside  Disposition: Status is:  Observation The patient remains OBS appropriate and will d/c before 2 midnights.     Time spent: 40 minutes  Length of inpatient stay: 0 days  Author: Carliss LELON Canales, DO 02/16/2024 2:24 PM  For on call review [email or web address].

## 2024-02-17 ENCOUNTER — Other Ambulatory Visit (HOSPITAL_COMMUNITY): Payer: Self-pay

## 2024-02-17 DIAGNOSIS — N179 Acute kidney failure, unspecified: Secondary | ICD-10-CM | POA: Diagnosis not present

## 2024-02-17 DIAGNOSIS — K7581 Nonalcoholic steatohepatitis (NASH): Secondary | ICD-10-CM | POA: Diagnosis not present

## 2024-02-17 DIAGNOSIS — K429 Umbilical hernia without obstruction or gangrene: Secondary | ICD-10-CM | POA: Diagnosis not present

## 2024-02-17 DIAGNOSIS — R188 Other ascites: Secondary | ICD-10-CM | POA: Diagnosis not present

## 2024-02-17 LAB — CBC
HCT: 31 % — ABNORMAL LOW (ref 39.0–52.0)
Hemoglobin: 9.9 g/dL — ABNORMAL LOW (ref 13.0–17.0)
MCH: 26.9 pg (ref 26.0–34.0)
MCHC: 31.9 g/dL (ref 30.0–36.0)
MCV: 84.2 fL (ref 80.0–100.0)
Platelets: 151 K/uL (ref 150–400)
RBC: 3.68 MIL/uL — ABNORMAL LOW (ref 4.22–5.81)
RDW: 14.4 % (ref 11.5–15.5)
WBC: 2.2 K/uL — ABNORMAL LOW (ref 4.0–10.5)
nRBC: 0 % (ref 0.0–0.2)

## 2024-02-17 LAB — COMPREHENSIVE METABOLIC PANEL WITH GFR
ALT: 10 U/L (ref 0–44)
AST: 15 U/L (ref 15–41)
Albumin: 3.4 g/dL — ABNORMAL LOW (ref 3.5–5.0)
Alkaline Phosphatase: 69 U/L (ref 38–126)
Anion gap: 10 (ref 5–15)
BUN: 19 mg/dL (ref 8–23)
CO2: 25 mmol/L (ref 22–32)
Calcium: 9.4 mg/dL (ref 8.9–10.3)
Chloride: 103 mmol/L (ref 98–111)
Creatinine, Ser: 1.29 mg/dL — ABNORMAL HIGH (ref 0.61–1.24)
GFR, Estimated: >60 mL/min (ref >60)
Glucose, Bld: 86 mg/dL (ref 70–99)
Potassium: 3.1 mmol/L — ABNORMAL LOW (ref 3.5–5.1)
Sodium: 138 mmol/L (ref 135–145)
Total Bilirubin: 0.9 mg/dL (ref 0.0–1.2)
Total Protein: 6.2 g/dL — ABNORMAL LOW (ref 6.5–8.1)

## 2024-02-17 LAB — GLUCOSE, CAPILLARY: Glucose-Capillary: 81 mg/dL (ref 70–99)

## 2024-02-17 LAB — PHOSPHORUS: Phosphorus: 3.3 mg/dL (ref 2.5–4.6)

## 2024-02-17 LAB — MAGNESIUM: Magnesium: 1.5 mg/dL — ABNORMAL LOW (ref 1.7–2.4)

## 2024-02-17 MED ORDER — POTASSIUM CHLORIDE 20 MEQ PO PACK
60.0000 meq | PACK | Freq: Once | ORAL | Status: AC
  Administered 2024-02-17: 60 meq via ORAL
  Filled 2024-02-17: qty 3

## 2024-02-17 MED ORDER — FUROSEMIDE 40 MG PO TABS
40.0000 mg | ORAL_TABLET | Freq: Every day | ORAL | 0 refills | Status: AC
  Filled 2024-02-17: qty 30, 30d supply, fill #0

## 2024-02-17 MED ORDER — SPIRONOLACTONE 100 MG PO TABS
100.0000 mg | ORAL_TABLET | Freq: Every day | ORAL | 0 refills | Status: AC
  Filled 2024-02-17: qty 30, 30d supply, fill #0

## 2024-02-17 NOTE — Discharge Summary (Signed)
 Physician Discharge Summary   Patient: Hunter Clarke MRN: [id number] DOB: 1955-11-14  Admit date:     02/15/2024  Discharge date: 02/17/24  Discharge Physician: Carliss LELON Canales   PCP: Sim Emery CROME, MD   Recommendations at discharge:    Pt to be discharged home.   If you experience worsening fever, chills, chest pain, shortness of breath, or other concerning symptoms, please call your PCP or go to the emergency department immediately.  Discharge Diagnoses: Principal Problem:   Abdominal ascites Active Problems:   Mixed hyperlipidemia   AKI (acute kidney injury)   Liver cirrhosis secondary to NASH (HCC)   Umbilical hernia without obstruction and without gangrene   Type 2 diabetes mellitus with hyperglycemia, with long-term current use of insulin  (HCC)  Resolved Problems:   * No resolved hospital problems. *   Hospital Course:  68 y.o. male with medical history significant for T2DM, HTN, HLD, GERD, umbilical hernia, rheumatoid arthritis, and NASH cirrhosis who presented to the ED for evaluation of abdominal swelling.    Assessment and Plan:   NASH cirrhosis with abdominal ascites - no previous history of paracentesis.  Recent diagnosis of cirrhosis with outpatient follow-up initiated.  Has not established with hepatology at this point.  S/p paracentesis 9 L.  Responded well to albumin.  Initiating Lasix p.o. 40 mg daily along with spironolactone 100 mg daily.  Recommend patient follow-up with hepatology/liver transplant team at discharge.  Patient states he has ambulatory referral in process.  Will also send referral for hepatology   Acute kidney injury - Concern for hepatorenal syndrome.  Creatinine 1.64 on presentation.  Showing mild improvement to 1. 29 this morning.  Initiating diuretics as above.     Diabetes mellitus - A1c 6.9 suggesting moderate control.  Currently on insulin  sliding scale, glargine 30 units twice daily.  Resume home medication regimen upon  discharge.   Chronic umbilical hernia - Followed by [facility].  Deemed significant surgical risk due to cirrhosis and noted ascites.   HTN - Initiating diuretics as above.  Monitor blood pressure closely.     HLD - Simvastatin  on board.   Class III obesity - BMI greater than 40 although fluid/water weight contributing to.  Encourage lifestyle modifications.  Patient initiated on Ozempic prior to admission.   Goals of care - Monitoring patient's renal function and blood pressure s/p paracentesis and initiation of diuretics.  Anticipate discharge home, recommendations to follow-up with hepatology ASAP in outpatient setting.   Consultants: Interventional radiology Procedures performed: Paracentesis Disposition: Home Diet recommendation:  Discharge Diet Orders (From admission, onward)     Start     Ordered   02/17/24 0000  Diet - low sodium heart healthy        02/17/24 1055           Regular diet  DISCHARGE MEDICATION: Allergies as of 02/17/2024       Reactions   Tetracycline Hives, Itching, Rash   Lisinopril  Itching, Rash   Tikosyn [dofetilide] Hives, Itching, Rash        Medication List     STOP taking these medications    acetaminophen  500 MG tablet Commonly known as: TYLENOL    hydrochlorothiazide  25 MG tablet Commonly known as: HYDRODIURIL        TAKE these medications    furosemide 40 MG tablet Commonly known as: LASIX Take 1 tablet (40 mg total) by mouth daily. Start taking on: February 18, 2024   glipiZIDE 10 MG tablet Commonly known  as: GLUCOTROL Take 10 mg by mouth 2 (two) times daily.   Lantus SoloStar 100 UNIT/ML Solostar Pen Generic drug: insulin  glargine Inject 30 Units into the skin 2 (two) times daily.   metFORMIN 1000 MG tablet Commonly known as: GLUCOPHAGE Take 1,000 mg by mouth 2 (two) times daily.   Ozempic (0.25 or 0.5 MG/DOSE) 2 MG/3ML Sopn Generic drug: Semaglutide(0.25 or 0.5MG /DOS) INJECT 0.25 MG ONCE A  WEEK FOR DIABETES CONTROL FOR 4 WEEKS. THEN INJECT 0.5MG  WEEKLY.   pantoprazole  40 MG tablet Commonly known as: PROTONIX  Take 40 mg by mouth daily.   simvastatin  40 MG tablet Commonly known as: ZOCOR  Take 40 mg by mouth at bedtime.   spironolactone 100 MG tablet Commonly known as: ALDACTONE Take 1 tablet (100 mg total) by mouth daily. Start taking on: February 18, 2024   traMADol  50 MG tablet Commonly known as: ULTRAM  Take 50 mg by mouth 2 (two) times daily as needed for moderate pain.         Discharge Exam: Filed Weights   02/15/24 1157  Weight: 126.2 kg    GENERAL:  Alert, pleasant, no acute distress  HEENT:  EOMI CARDIOVASCULAR:  RRR, no murmurs appreciated RESPIRATORY:  Clear to auscultation, no wheezing, rales, or rhonchi GASTROINTESTINAL:  Soft, mildly distended, fluid wave, umbilical hernia EXTREMITIES: Trace LE edema bilaterally NEURO:  No new focal deficits appreciated SKIN:  No rashes noted PSYCH:  Appropriate mood and affect    Condition at discharge: improving  The results of significant diagnostics from this hospitalization (including imaging, microbiology, ancillary and laboratory) are listed below for reference.   Imaging Studies: US  Paracentesis Result Date: 02/16/2024 INDICATION: Patient with a history of cirrhosis presents today with ascites. Interventional radiology asked to perform a therapeutic paracentesis. EXAM: ULTRASOUND GUIDED PARACENTESIS MEDICATIONS: 1% lidocaine 10 mL COMPLICATIONS: None immediate. PROCEDURE: Informed written consent was obtained from the patient after a discussion of the risks, benefits and alternatives to treatment. A timeout was performed prior to the initiation of the procedure. Initial ultrasound scanning demonstrates a large amount of ascites within the left lower abdominal quadrant. The left lower abdomen was prepped and draped in the usual sterile fashion. 1% lidocaine was used for local anesthesia. Following this,  a 19 gauge, 7-cm, Yueh catheter was introduced. An ultrasound image was saved for documentation purposes. The paracentesis was performed. The catheter was removed and a dressing was applied. The patient tolerated the procedure well without immediate post procedural complication. FINDINGS: A total of approximately 9 L of clear yellow fluid was removed. IMPRESSION: Successful ultrasound-guided paracentesis yielding 9 liters of peritoneal fluid. Procedure performed: Warren Dais, NP PLAN: If the patient eventually requires >/=2 paracenteses in a 30 day period, candidacy for formal evaluation by the [facility] Interventional Radiology Portal Hypertension Clinic will be assessed. Electronically Signed   By: Wilkie Lent M.D.   On: 02/16/2024 13:37   CT Renal Stone Study Result Date: 02/15/2024 EXAM: CT UROGRAM 02/15/2024 12:39:35 PM TECHNIQUE: CT of the abdomen and pelvis was performed without intravenous contrast. Multiplanar reformatted images as well as MIP urogram images are provided for review. Automated exposure control, iterative reconstruction, and/or weight based adjustment of the mA/kV was utilized to reduce the radiation dose to as low as reasonably achievable. COMPARISON: 12/26/2023 CLINICAL HISTORY: Abdominal Pain; fluid retention; Shortness of Breath. FINDINGS: LOWER CHEST: No acute abnormality. LIVER: Hepatic cirrhosis is noted. GALLBLADDER AND BILE DUCTS: Gallbladder is unremarkable. No biliary ductal dilatation. SPLEEN: Mild splenomegaly is noted. PANCREAS: No acute  abnormality. ADRENAL GLANDS: No acute abnormality. KIDNEYS, URETERS AND BLADDER: No stones in the kidneys or ureters. No hydronephrosis. No perinephric or periureteral stranding. Urinary bladder is unremarkable. GI AND BOWEL: Stomach demonstrates no acute abnormality. There is no bowel obstruction. PERITONEUM AND RETROPERITONEUM: Moderate ascites is noted. No free air. VASCULATURE: Aorta is normal in caliber. LYMPH NODES: No  lymphadenopathy. REPRODUCTIVE ORGANS: No acute abnormality. BONES AND SOFT TISSUES: Moderate-sized periumbilical hernia is noted which contains fluid. No acute osseous abnormality. IMPRESSION: 1. Hepatic cirrhosis with moderate ascites and mild splenomegaly. 2. Moderate periumbilical hernia containing fluid. Electronically signed by: Lynwood Seip MD 02/15/2024 01:53 PM EDT RP Workstation: [id number]    Microbiology: No results found for this or any previous visit.  Labs: CBC: Recent Labs  Lab 02/15/24 1153 02/16/24 0151 02/17/24 0539  WBC 4.0 2.7* 2.2*  NEUTROABS 2.7  --   --   HGB 11.6* 10.2* 9.9*  HCT 36.7* 32.5* 31.0*  MCV 83.2 84.0 84.2  PLT 181 153 151   Basic Metabolic Panel: Recent Labs  Lab 02/15/24 1153 02/16/24 0151 02/17/24 0539  NA 140 140 138  K 3.9 4.1 3.1*  CL 106 104 103  CO2 25 28 25   GLUCOSE 134* 207* 86  BUN 17 20 19   CREATININE 1.64* 1.56* 1.29*  CALCIUM 9.8 9.4 9.4  MG  --   --  1.5*  PHOS  --   --  3.3   Liver Function Tests: Recent Labs  Lab 02/15/24 1153 02/17/24 0539  AST 23 15  ALT 15 10  ALKPHOS 113 69  BILITOT 1.2 0.9  PROT 6.7 6.2*  ALBUMIN 2.9* 3.4*   CBG: Recent Labs  Lab 02/16/24 0749 02/16/24 1229 02/16/24 1551 02/16/24 2158 02/17/24 0819  GLUCAP 76 83 121* 86 81    Discharge time spent: 28 minutes.  Length of inpatient stay: 1 days  Signed: Carliss LELON Canales, DO [facility] 02/17/2024

## 2024-02-17 NOTE — Plan of Care (Signed)

## 2024-02-18 LAB — GLUCOSE, CAPILLARY: Glucose-Capillary: 60 mg/dL — ABNORMAL LOW (ref 70–99)

## 2024-05-11 NOTE — Progress Notes (Signed)
 " [facility]  Date: May 13, 2024  Performed by: Philippe J Zamor, MD  Patient: Hunter Clarke Age: 69 y.o.        DOB: 1956-01-31  MRN: [id number] Sex: male     Referring Provider: Sydelle Fritz, FNP  PCP: Leotis Lesches, NP [facility]   Subjective:   Hunter Clarke is a 69 y.o. male seen at the request of NP Gaither regarding decompensated MASH cirrhosis.  He was told many years ago that he had cirrhosis. Unclear who was following him for liver disease.  In reviewing his chart, he was admitted to [facility] Oct 2025 for decompensated cirrhosis and ascites. No GI consult at that time. That was his first ever hospital admission. He was started on diuretics at that time: spironolactone  100 mg and lasix  40 mg daily. He did require paracentesis, but none since Oct 2025. During that hospitalization, he had negative HIV testing, but I do not see testing for viral hepatitis.  He also has a large symptomatic umbilical hernia, but was obviously deemed to be a surgical candidate.  He has seen Dr. Kristie of GI in the past.  He has also seen a surgeon in the past for hernia evaluation, but obviously deemed not a candidate for hernia repair due to liver disease/ascites.  He has lost a lot of muscle mass.  He had lab work done yesterday, but patient does not have results, nor were results sent to us .  His A1c has gotten worse, he tells me that it is now 9.6.  I reviewed extensive outside medical records  Social History: He is married. Wife is in wheelchair and morbidly obese according to patient Previous tobacco Denies ETOH or drugs  Family History: No family history of GI or liver disorders  Current Outpatient Medications  Medication Instructions   amLODIPine (NORVASC) 5 mg, oral, Daily, for high blood pressure. Takes if BP >160 systolic   furosemide  (LASIX ) 40 mg, Daily   glipiZIDE  (GLUCOTROL ) 10 mg, 2 times daily before meals   Lantus  Solostar U-100 Insulin  30  Units, 2 times daily   losartan (COZAAR) 100 mg, Daily   metFORMIN (GLUCOPHAGE) 1,000 mg, 2 times daily with meals   Nano 2nd Gen Pen Needle 32 gauge x 5/32 ndle 2 times daily   Ozempic 0.5 mg, Every 7 days   pantoprazole  (PROTONIX ) 40 mg, Every morning before breakfast   simvastatin  (ZOCOR ) 40 mg, At bedtime   spironolactone  (ALDACTONE ) 100 mg, Every morning    REVIEW OF SYSTEMS: as noted above, otherwise ROS negative on 14 ROS   Objective:    Vitals:   05/13/24 0939  BP: 123/61  Pulse: 78  Temp: 96.7 F (35.9 C)  SpO2: 100%    Vitals:   05/13/24 0939  BP: 123/61  BP Location: Right arm  Patient Position: Sitting  Pulse: 78  Temp: 96.7 F (35.9 C)  TempSrc: Temporal  SpO2: 100%  Weight: 111 kg (244 lb 9.6 oz)  Height: 1.702 m (5' 7)     General: No apparent distress. Alert and oriented. Comes to clinic alone HEENT:  sclera anicteric, EOMI, normocephalic, moist mucous membranes, pupils are equal, round and reactive to light, extraocular eye movements are intact, no oral lesions Neck: supple, non-tender, no thyromegaly, no masses Cardiovascular: s1s2 nml, no M/R/G, normal peripheral perfusion,  No edema.  Respiratory: Clear to auscultation bilaterally, normal respiratory effort. Gastrointestinal: No hepatosplenomegaly, no masses, large umbilical hernia is noted, normal bowel sounds, soft, non tender,  not distended. Lymphatic: no axillary or cervical lymphadenopathy Neurologic: Awake, alert, non-focal, no asterixis, no tremors, CN 2-12 intact, normal sensory Psychiatric: Cooperative, appropriate affect. Musculoskeletal:  Normal gait, notable for sarcopenia Skin: warm and dry, no rashes or ulcers Extremity: No clubbing or cyanosis  LABS / IMAGING: MELD 3.0: 10 at 05/13/2024 11:35 AM MELD-Na: 10 at 05/13/2024 11:35 AM Calculated from: Serum Creatinine: 1.29 mg/dL at 8/86/7973 88:64 AM Serum Sodium: 137 mmol/L at 05/13/2024 11:35 AM Total Bilirubin: 0.7  mg/dL (Using min of 1 mg/dL) at 8/86/7973 88:64 AM Serum Albumin : 3.9 g/dL (Using max of 3.5 g/dL) at 8/86/7973 88:64 AM INR(ratio): 1.1 at 05/13/2024 11:35 AM Age at listing (hypothetical): 65 years Sex: Male at 05/13/2024 11:35 AM   I requested outside imaging I personally viewed interpreted abdominal CT scan images dated February 15, 2024.  Please note these were without contrast I also personally reviewed and interpreted CT scan images dated December 26, 2023.    Assessment/Plan:   Problem List Items Addressed This Visit   None Visit Diagnoses       Liver cirrhosis secondary to NASH (CMD)    -  Primary   Relevant Orders   Comprehensive Metabolic Panel   Prothrombin Time (PT) with INR   Alpha-Fetoprotein (AFP), Tumor Marker   Hepatitis C Virus (HCV) Antibody Screen With Confirmation   HBV Screening and Diagnosis     Ascites of liver       Relevant Orders   Comprehensive Metabolic Panel   Prothrombin Time (PT) with INR   Alpha-Fetoprotein (AFP), Tumor Marker   Hepatitis C Virus (HCV) Antibody Screen With Confirmation   HBV Screening and Diagnosis     Umbilical hernia without obstruction and without gangrene       Relevant Orders   Comprehensive Metabolic Panel   Prothrombin Time (PT) with INR   Alpha-Fetoprotein (AFP), Tumor Marker   Hepatitis C Virus (HCV) Antibody Screen With Confirmation   HBV Screening and Diagnosis     Sarcopenia         Decompensated Nash related cirrhosis.  I do have obvious concerns about his candidacy for liver transplantation should he ever need it: lack of support (disabled wife at home), and uncontrolled diabetes. His MELD score is low at this time, so no indication to initiate liver transplant evaluation anyway. Apparently had lab work yesterday with his primary care but doubtful that PT/INR was checked so we will recheck labs today to assess his MELD score: CMP plus PT/INR.  Also sending additional serologies for hepatitis B and C  testing. Patient needs appropriate imaging of his liver.  He needs a triple phase protocol CT scan.  I will also arrange for a 2D cardiac echo in the event that we need to proceed with TIPS. Patient needs an EGD to screen for esophageal varices.  He has seen Dr Kristie in the past.  Will refer back to Dr. Kristie  Ascites.  I reviewed the importance of avoiding sodium.  2 g sodium diet should be reinforced by all of his other providers.  He is on diuretics, the same doses for the last 3 months.  I will reimage him to assess his degree of ascites and perhaps we can increase his diuretic doses.  Has been seen by surgery for his umbilical hernia, and no surprise the surgeon did not want to schedule an elective hernia repair.  The way to improve this umbilical hernia would be to improve his ascites.  Sarcopenia.  Again he  has lost a lot of muscle mass.  He has not seen a dietitian recently.  Patient is to increase his intake of protein.  Needs close to 120 g of protein per day.  This can be further reviewed by his primary care providers and since he is diabetic should absolutely see a dietitian.  I think he would greatly benefit from this.  Philippe J. Zamor, MD, FAASLD [facility]   Moderate level medical decision making "

## 2024-05-13 ENCOUNTER — Other Ambulatory Visit (HOSPITAL_COMMUNITY): Payer: Self-pay | Admitting: Internal Medicine

## 2024-05-13 DIAGNOSIS — K7469 Other cirrhosis of liver: Secondary | ICD-10-CM

## 2024-05-13 DIAGNOSIS — K7031 Alcoholic cirrhosis of liver with ascites: Secondary | ICD-10-CM

## 2024-05-19 ENCOUNTER — Ambulatory Visit (HOSPITAL_COMMUNITY)
Admission: RE | Admit: 2024-05-19 | Discharge: 2024-05-19 | Disposition: A | Discharge status: Home / Self Care | Source: Ambulatory Visit | Attending: Internal Medicine | Admitting: Internal Medicine

## 2024-05-19 DIAGNOSIS — K7581 Nonalcoholic steatohepatitis (NASH): Secondary | ICD-10-CM | POA: Insufficient documentation

## 2024-05-19 DIAGNOSIS — K7031 Alcoholic cirrhosis of liver with ascites: Secondary | ICD-10-CM | POA: Diagnosis present

## 2024-05-19 DIAGNOSIS — K7469 Other cirrhosis of liver: Secondary | ICD-10-CM | POA: Insufficient documentation

## 2024-05-19 MED ORDER — IOHEXOL 350 MG/ML SOLN
100.0000 mL | Freq: Once | INTRAVENOUS | Status: AC | PRN
  Administered 2024-05-19: 100 mL via INTRAVENOUS

## 2024-05-23 ENCOUNTER — Other Ambulatory Visit (HOSPITAL_COMMUNITY): Payer: Self-pay | Admitting: Internal Medicine

## 2024-05-23 DIAGNOSIS — R188 Other ascites: Secondary | ICD-10-CM

## 2024-06-25 ENCOUNTER — Ambulatory Visit (HOSPITAL_COMMUNITY)
